# Patient Record
Sex: Female | Born: 2000 | Race: Black or African American | Hispanic: No | Marital: Single | Smoking: Never smoker
Health system: Southern US, Community
[De-identification: ages and names within clinical notes are randomized; demographics above are authoritative.]

## PROBLEM LIST (undated history)

## (undated) DIAGNOSIS — A749 Chlamydial infection, unspecified: Secondary | ICD-10-CM

## (undated) DIAGNOSIS — F32A Depression, unspecified: Secondary | ICD-10-CM

## (undated) DIAGNOSIS — T7840XA Allergy, unspecified, initial encounter: Secondary | ICD-10-CM

## (undated) DIAGNOSIS — J45909 Unspecified asthma, uncomplicated: Secondary | ICD-10-CM

## (undated) DIAGNOSIS — F419 Anxiety disorder, unspecified: Secondary | ICD-10-CM

## (undated) HISTORY — DX: Chlamydial infection, unspecified: A74.9

## (undated) HISTORY — DX: Allergy, unspecified, initial encounter: T78.40XA

## (undated) HISTORY — DX: Unspecified asthma, uncomplicated: J45.909

## (undated) HISTORY — DX: Anxiety disorder, unspecified: F41.9

## (undated) HISTORY — PX: NO PAST SURGERIES: SHX2092

## (undated) HISTORY — DX: Depression, unspecified: F32.A

---
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
## 2021-11-10 ENCOUNTER — Other Ambulatory Visit: Payer: Self-pay

## 2021-11-10 ENCOUNTER — Ambulatory Visit (INDEPENDENT_AMBULATORY_CARE_PROVIDER_SITE_OTHER): Payer: Medicaid Other | Admitting: Obstetrics

## 2021-11-10 ENCOUNTER — Encounter: Payer: Self-pay | Admitting: Obstetrics

## 2021-11-10 ENCOUNTER — Other Ambulatory Visit (HOSPITAL_COMMUNITY)
Admission: RE | Admit: 2021-11-10 | Discharge: 2021-11-10 | Disposition: A | Discharge status: Home / Self Care | Payer: Medicaid Other | Source: Ambulatory Visit | Attending: Obstetrics | Admitting: Obstetrics

## 2021-11-10 VITALS — BP 118/74 | Ht 62.0 in | Wt 180.0 lb

## 2021-11-10 DIAGNOSIS — N898 Other specified noninflammatory disorders of vagina: Secondary | ICD-10-CM | POA: Insufficient documentation

## 2021-11-10 NOTE — Progress Notes (Signed)
[name] is a 20 y.o. No obstetric history on file. who LMP was Patient's last menstrual period was 10/16/2021 (exact date)., presents today for a problem visit.   Patient complains of an abnormal vaginal discharge for 3 months. Discharge described as: white, curd-like, and odorless. Vaginal symptoms include bumps and vulvar itching.   Other associated symptoms: none.Menstrual pattern: She had been bleeding regularly. Contraception: none.  She denies recent antibiotic exposure, has changes in soaps, detergents coinciding with the onset of her symptoms.  She has previously self treated or been under treatment by another provider for these symptoms.  She is not presently sexually active. She also points out some bumps along her inner thighs that are similar to some under her arms. She has never had these evaluated by dermatology. She is due for an annual exam in late January.  O: BP 118/74    Ht 5\' 2"  (1.575 m)    Wt 180 lb (81.6 kg)    LMP 10/16/2021 (Exact Date)    BMI 32.92 kg/m  Review of Systems  Constitutional: Negative.   HENT: Negative.    Eyes: Negative.   Respiratory: Negative.    Cardiovascular: Negative.   Gastrointestinal: Negative.   Genitourinary: Negative.   Musculoskeletal: Negative.   Skin:        Bumps along her inner thighs that extend from her geitalia down along the thighs. Spec exam- scant white thin discharge, non malodorous, vaginal walls are slightly ruddy in appearance.  Neurological: Negative.   Endo/Heme/Allergies: Negative.   Psychiatric/Behavioral: Negative.    Physical Exam Constitutional:      Appearance: Normal appearance.  HENT:     Head: Normocephalic and atraumatic.  Cardiovascular:     Rate and Rhythm: Normal rate and regular rhythm.  Pulmonary:     Effort: Pulmonary effort is normal.     Breath sounds: Normal breath sounds.  Abdominal:     General: Abdomen is flat.     Palpations: Abdomen is soft.  Genitourinary:    Vagina: Vaginal  discharge present.     Rectum: Normal.     Comments: Numerous slightly raised bumps noted along inner thigh ; some appear possibly like condyloma.  Others run further down her thighs- and she has similar ones under her armpits and on her arms. Scant vaginal discharge noted: white, thin, non malodorous.- Aptima swab sent. Musculoskeletal:        General: Normal range of motion.     Cervical back: Normal range of motion.  Skin:    General: Skin is warm and dry.  Neurological:     General: No focal deficit present.     Mental Status: She is alert and oriented to person, place, and time.  Psychiatric:        Mood and Affect: Mood normal.        Behavior: Behavior normal.      1) Risk factors for bacterial vaginosis and candida infections discussed.  We discussed normal vaginal flora/microbiome.  Any factors that may alter the microbiome increase the risk of these opportunistic infections.  These include changes in pH, antibiotic exposures, diabetes, wet bathing suits etc.  We discussed that treatment is aimed at eradicating abnormal bacterial overgrowth and or yeast.  There may be some role for vaginal probiotics in restoring normal vaginal flora.    Aptima swab sent. Will treat per the results. Advised her to make an appointment with a dermatologist to evaluate the bumps I under her arms and elsewhere.  She will make an appointment for an Annual physical in January.  Alexandra Boyle, CNM  11/10/2021 5:12 PM

## 2021-11-12 LAB — CERVICOVAGINAL ANCILLARY ONLY
Bacterial Vaginitis (gardnerella): POSITIVE — AB
Candida Glabrata: NEGATIVE
Candida Vaginitis: NEGATIVE
Chlamydia: NEGATIVE
Comment: NEGATIVE
Comment: NEGATIVE
Comment: NEGATIVE
Comment: NEGATIVE
Comment: NEGATIVE
Comment: NORMAL
Neisseria Gonorrhea: NEGATIVE
Trichomonas: NEGATIVE

## 2021-11-13 ENCOUNTER — Encounter: Payer: Self-pay | Admitting: Obstetrics

## 2021-11-13 ENCOUNTER — Other Ambulatory Visit: Payer: Self-pay | Admitting: Obstetrics

## 2021-11-13 DIAGNOSIS — N760 Acute vaginitis: Secondary | ICD-10-CM

## 2021-11-13 MED ORDER — METRONIDAZOLE 500 MG PO TABS: 500.0000 mg | ORAL_TABLET | Freq: Two times a day (BID) | ORAL | 0 refills | Status: AC

## 2021-11-13 NOTE — Progress Notes (Unsigned)
metronidazole 

## 2021-12-01 ENCOUNTER — Ambulatory Visit (INDEPENDENT_AMBULATORY_CARE_PROVIDER_SITE_OTHER): Admitting: Obstetrics

## 2021-12-01 ENCOUNTER — Other Ambulatory Visit: Payer: Self-pay

## 2021-12-01 ENCOUNTER — Encounter: Payer: Self-pay | Admitting: Obstetrics

## 2021-12-01 VITALS — BP 118/74 | Ht 62.0 in | Wt 181.0 lb

## 2021-12-01 DIAGNOSIS — Z113 Encounter for screening for infections with a predominantly sexual mode of transmission: Secondary | ICD-10-CM

## 2021-12-01 DIAGNOSIS — Z01419 Encounter for gynecological examination (general) (routine) without abnormal findings: Secondary | ICD-10-CM

## 2021-12-01 NOTE — Progress Notes (Signed)
Gynecology Annual Exam  PCP: [facility]  Chief Complaint:  Chief Complaint  Patient presents with   Annual Exam    History of Present Illness:  [name] is a 21 y.o. G0P0000 who LMP was Patient's last menstrual period was 11/18/2021 (exact date)., presents today for her annual examination.  Her menses are regular every 28-30 days, lasting 5 day(s).  Dysmenorrhea mild, occurring  occasionally . She does not have intermenstrual bleeding.  She is not sexually active.  Last Pap: NA for her age Hx of STDs: none  There is no FH of breast cancer. There is no FH of ovarian cancer. The patient does not do self-breast exams.  Tobacco use: The patient denies current or previous tobacco use. Alcohol use: none Exercise: moderately active    The patient wears seatbelts: yes.   The patient reports that domestic violence in her life is absent.   Past Medical History:  Diagnosis Date   Chlamydia     Past Surgical History:  Procedure Laterality Date   NO PAST SURGERIES      Prior to Admission medications   Medication Sig Start Date End Date Taking? Authorizing Provider  Norethindrone-Ethinyl Estradiol-Fe Biphas (LO LOESTRIN FE) 1 MG-10 MCG / 10 MCG tablet Take 1 tablet by mouth daily. 11/28/20 02/20/21  [name], CNM    Allergies  Allergen Reactions   Nickel Swelling and Rash    At site of exposure    Gynecologic History: Patient's last menstrual period was 11/18/2021 (exact date). History of abnormal pap smear: No History of STI: Yes   Obstetric History: G0P0000  Social History   Socioeconomic History   Marital status: Single    Spouse name: Not on file   Number of children: Not on file   Years of education: Not on file   Highest education level: Not on file  Occupational History   Not on file  Tobacco Use   Smoking status: Never   Smokeless tobacco: Never  Substance and Sexual Activity   Alcohol use: Not on file    Comment: occ   Drug use:  Yes    Types: Marijuana   Sexual activity: Not Currently    Birth control/protection: Condom  Other Topics Concern   Not on file  Social History Narrative   Not on file   Social Determinants of Health   Financial Resource Strain: Not on file  Food Insecurity: Not on file  Transportation Needs: Not on file  Physical Activity: Not on file  Stress: Not on file  Social Connections: Not on file  Intimate Partner Violence: Not on file    No family history on file.  Review of Systems  Constitutional: Negative.   HENT: Negative.    Eyes: Negative.   Respiratory: Negative.    Cardiovascular: Negative.   Skin: Negative.   All other systems reviewed and are negative.   Physical Exam BP 118/74    Ht 5\' 2"  (1.575 m)    Wt 181 lb (82.1 kg)    LMP 11/18/2021 (Exact Date)    BMI 33.11 kg/m    Physical Exam Constitutional:      Appearance: Normal appearance. She is obese.  Genitourinary:     Vulva and rectum normal.     Genitourinary Comments: No external lesions. Multiple slightly raisedbumps noted along her inner thighs. NOT HSV like. No vaginal discharge today. Aptima swab gathered at her request.Uterus is non enlarged, mobile, anteverted.  HENT:     Head:  Normocephalic and atraumatic.  Cardiovascular:     Rate and Rhythm: Normal rate and regular rhythm.     Pulses: Normal pulses.     Heart sounds: Normal heart sounds.  Pulmonary:     Effort: Pulmonary effort is normal.     Breath sounds: Normal breath sounds.  Abdominal:     General: Bowel sounds are normal.     Palpations: Abdomen is soft.  Musculoskeletal:        General: Normal range of motion.     Cervical back: Normal range of motion and neck supple.  Neurological:     General: No focal deficit present.     Mental Status: She is alert and oriented to person, place, and time.  Skin:    General: Skin is warm and dry.  Psychiatric:        Mood and Affect: Mood normal.        Behavior: Behavior normal.    Female  chaperone present for pelvic and breast  portions of the physical exam  Results:  PHQ-9: 1   Assessment: 21 y.o. G0P0000 female here for routine annual gynecologic examination Requesting blood work for STI. Recently treated for BV Abstinent  Plan: Problem List Items Addressed This Visit   None Visit Diagnoses     Screen for STD (sexually transmitted disease)    -  Primary   Relevant Orders   HEP, RPR, HIV Panel   Women's annual routine gynecological examination           Screening: -- Blood pressure screen normal -- Weight screening: normal -- Depression screening negative (PHQ-9) -- Nutrition: normal -- cholesterol screening: not due for screening -- osteoporosis screening: not due -- tobacco screening: not using -- alcohol screening: AUDIT questionnaire indicates low-risk usage. -- family history of breast cancer screening: done. not at high risk. -- no evidence of domestic violence or intimate partner violence. -- STD screening: gonorrhea/chlamydia NAAT collected -- pap smear not collected per ASCCP guidelines -- flu vaccine  declines- per her PCP -- HPV vaccination series: receivedin the past  [name], CNM  12/01/2021 9:14 AM   12/01/2021 9:10 AM

## 2021-12-02 ENCOUNTER — Other Ambulatory Visit (HOSPITAL_COMMUNITY)
Admission: RE | Admit: 2021-12-02 | Discharge: 2021-12-02 | Disposition: A | Discharge status: Home / Self Care | Source: Ambulatory Visit | Attending: Obstetrics | Admitting: Obstetrics

## 2021-12-02 ENCOUNTER — Encounter: Payer: Self-pay | Admitting: Obstetrics

## 2021-12-02 DIAGNOSIS — Z113 Encounter for screening for infections with a predominantly sexual mode of transmission: Secondary | ICD-10-CM | POA: Diagnosis present

## 2021-12-02 LAB — HEP, RPR, HIV PANEL
HIV Screen 4th Generation wRfx: NONREACTIVE
Hepatitis B Surface Ag: NEGATIVE
RPR Ser Ql: NONREACTIVE

## 2021-12-02 NOTE — Addendum Note (Signed)
Addended by: Imagene Riches on: 12/02/2021 09:41 AM   Modules accepted: Orders

## 2021-12-03 LAB — CERVICOVAGINAL ANCILLARY ONLY
Bacterial Vaginitis (gardnerella): NEGATIVE
Candida Glabrata: NEGATIVE
Candida Vaginitis: NEGATIVE
Chlamydia: NEGATIVE
Comment: NEGATIVE
Comment: NEGATIVE
Comment: NEGATIVE
Comment: NEGATIVE
Comment: NEGATIVE
Comment: NORMAL
Neisseria Gonorrhea: NEGATIVE
Trichomonas: NEGATIVE

## 2022-04-27 IMAGING — US US BREAST*R* LIMITED INC AXILLA
1 series · 14 of 15 positions shown · non-contrast
Comparison: None.

CLINICAL DATA: 19-year-old female presenting with a new lump in the
right breast.

EXAM:
ULTRASOUND OF THE RIGHT BREAST

[Series 1: us breast*right* limited inc axilla · 0.06mm/px · 14 of 15 slices shown]
[im 1/15]
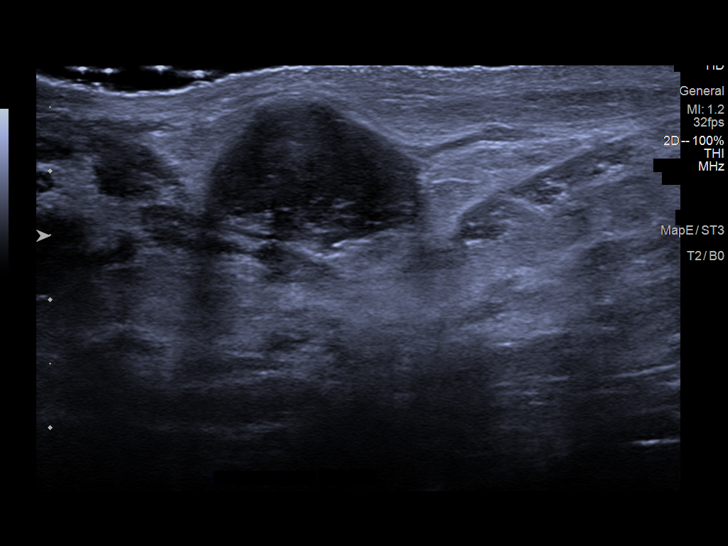
[im 2/15]
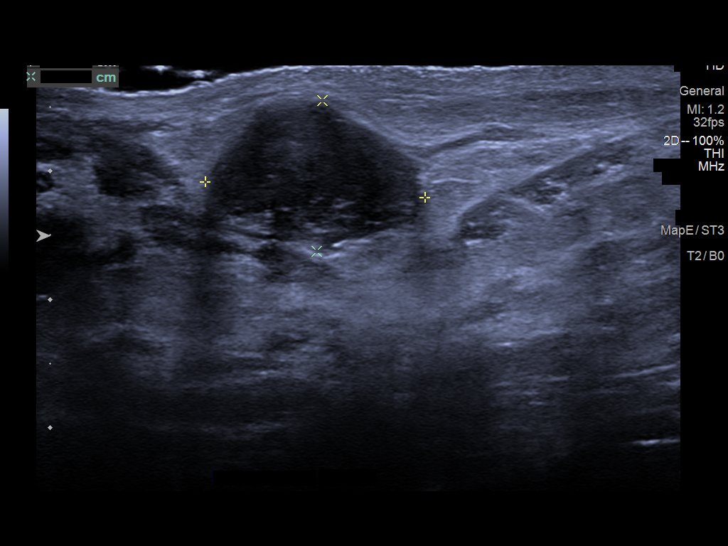
[im 3/15]
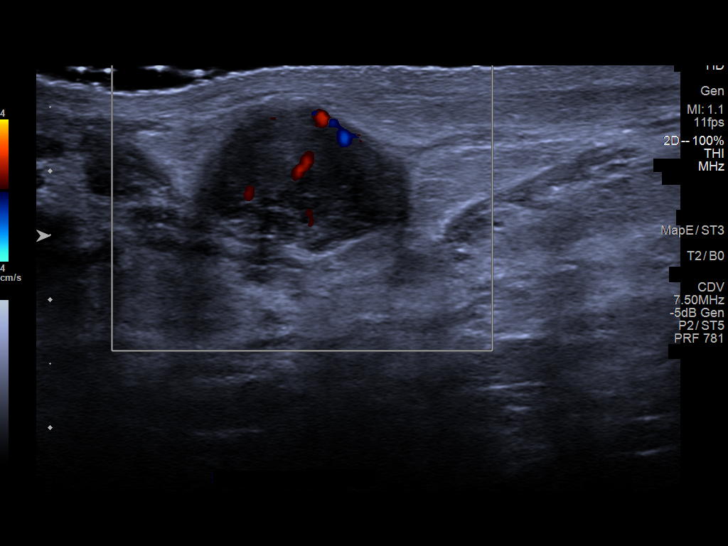
[im 4/15]
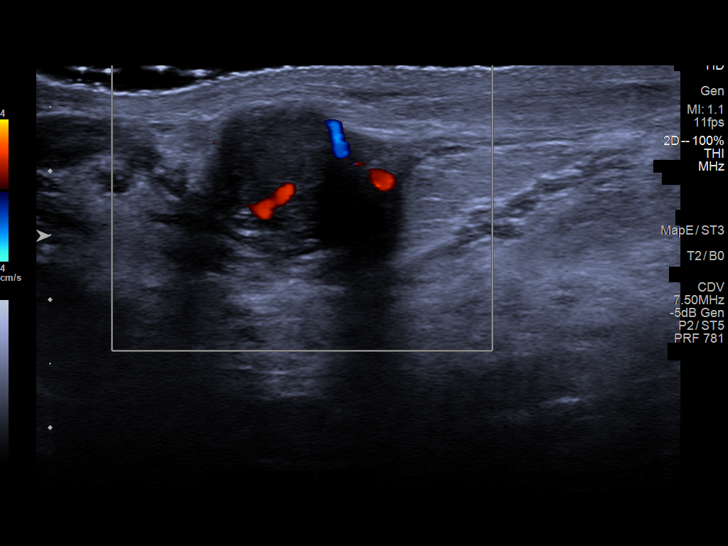
[im 5/15]
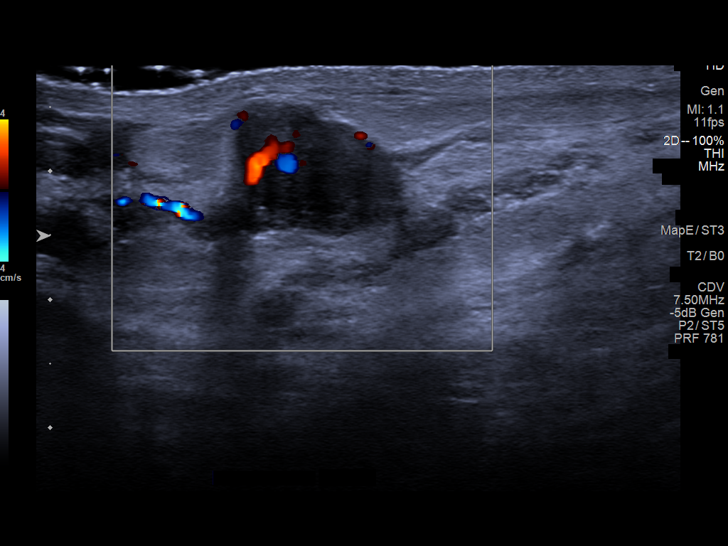
[im 6/15]
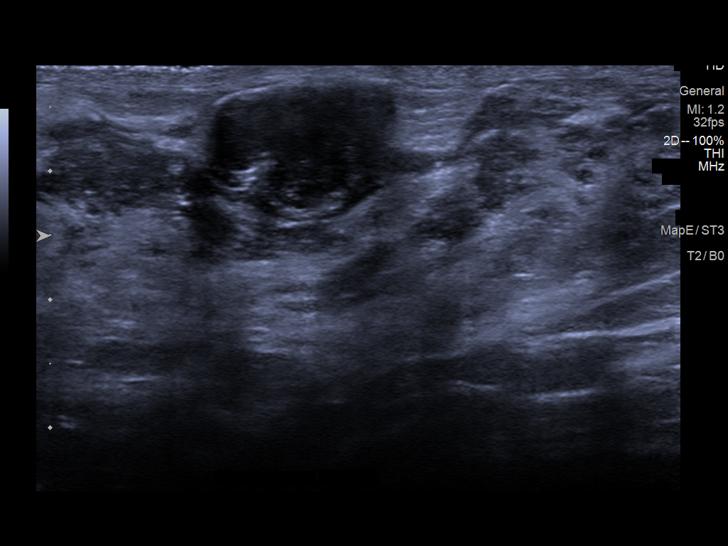
[im 7/15]
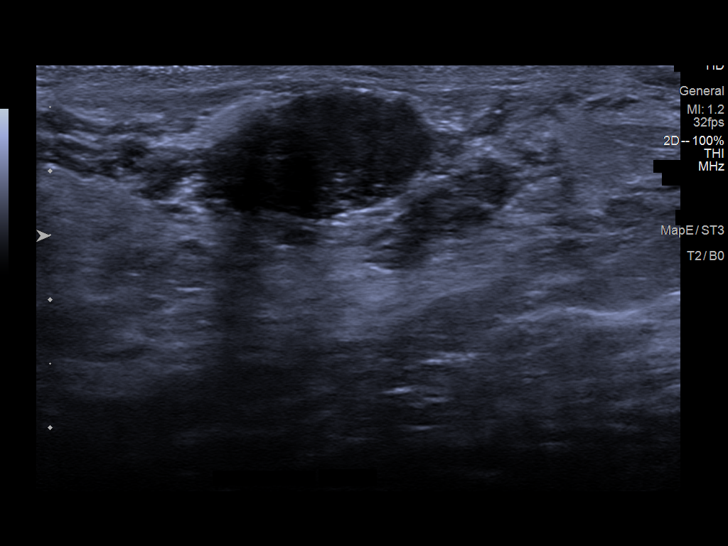
[im 9/15]
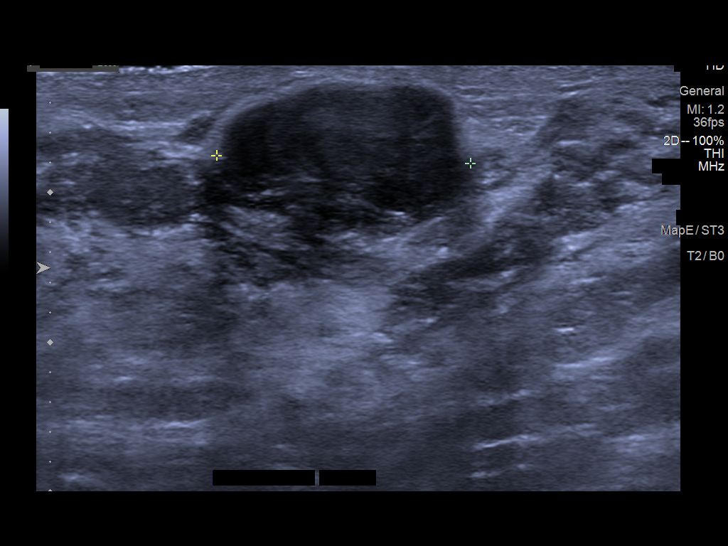
[im 10/15]
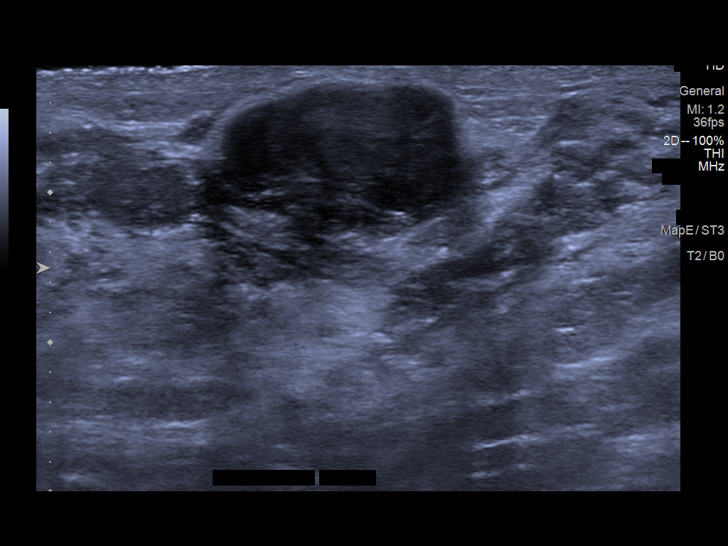
[im 11/15]
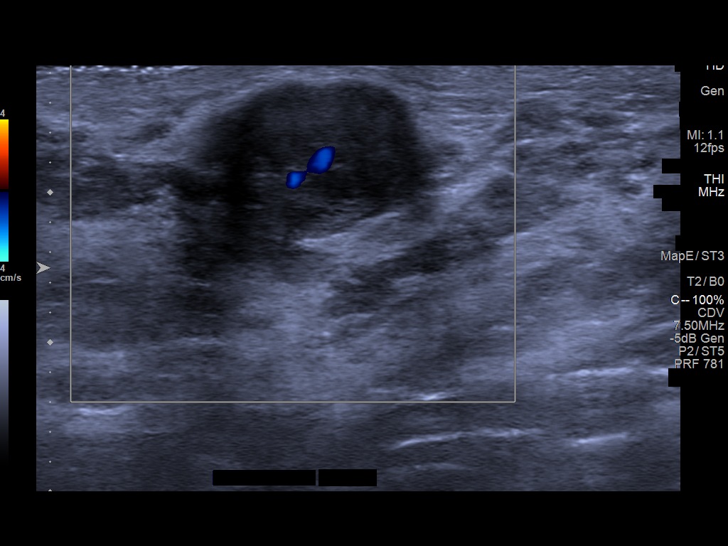
[im 12/15]
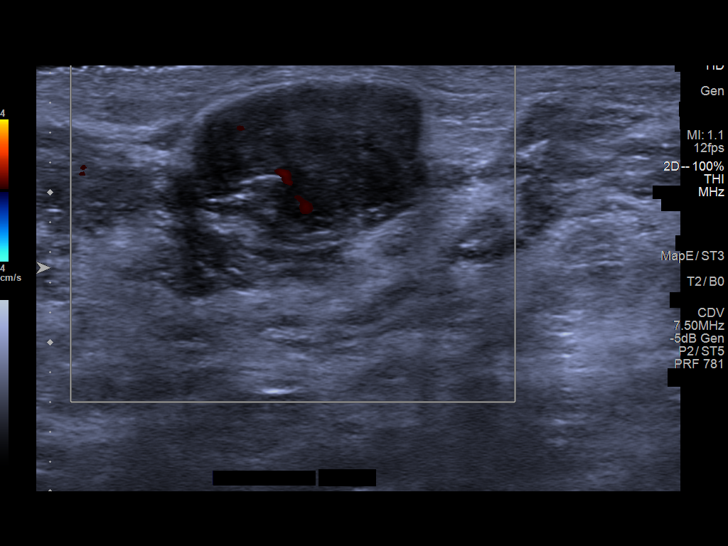
[im 13/15]
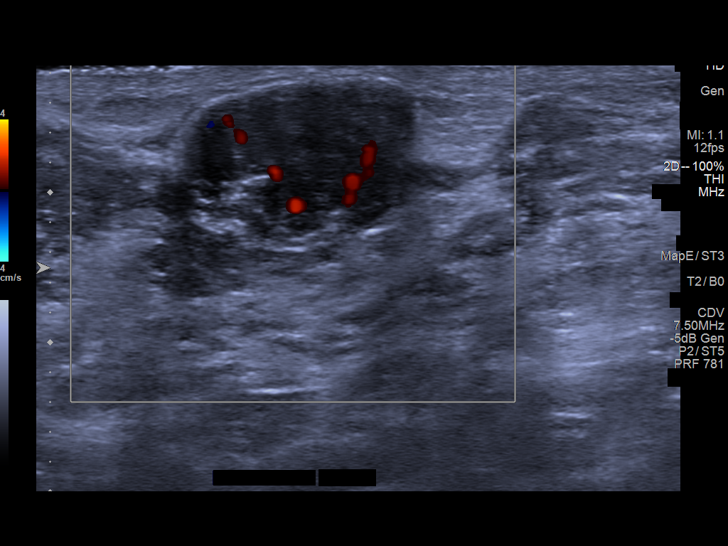
[im 14/15]
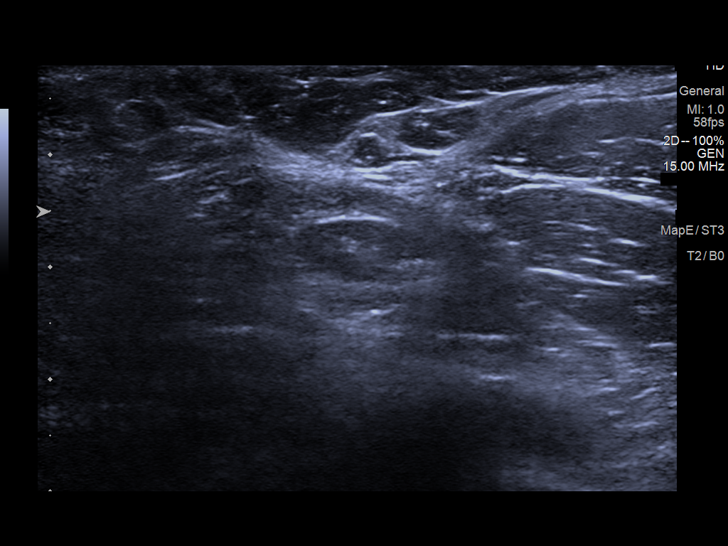
[im 15/15]
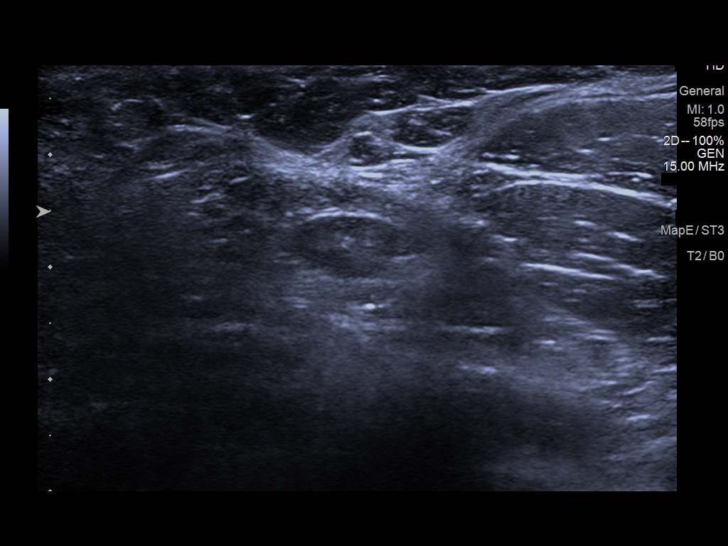

[14 of 15 positions shown; findings below may reference images not displayed]

FINDINGS: On physical exam, I feel a discrete mass at the site of concern
reported by the patient in the upper inner right breast.

Targeted ultrasound is performed in the right breast at 1 o'clock 2
cm from nipple at the palpable site of concern demonstrating an oval
circumscribed hypoechoic mass with some heterogeneity of the
echotexture along the inferior portion of the mass. The mass overall
measures 1.7 x 1.2 x 1.7 cm. There is internal vascularity. Targeted
ultrasound of the right axilla demonstrates normal-appearing lymph
nodes.
IMPRESSION: Indeterminate mass in the right breast at 1 o'clock, possibly a
fibroadenoma.

RECOMMENDATION:
Ultrasound-guided core needle biopsy of the right breast mass at 1
o'clock.

I have discussed the findings and recommendations with the patient
who agrees to proceed with biopsy. If applicable, a reminder letter
will be sent to the patient regarding the next appointment.

BI-RADS CATEGORY  4: Suspicious.

## 2022-05-03 ENCOUNTER — Ambulatory Visit: Admitting: Dermatology

## 2022-05-07 IMAGING — MG US  BREAST BX W/ LOC DEV 1ST LESION IMG BX SPEC US GUIDE*R*
1 series · 3 of 3 positions shown · non-contrast
Comparison: Previous exam(s).
COMPARISON: Previous exam(s).
COMPARISON: Previous exam(s).

Addendum:
CLINICAL DATA: Patient presents for ultrasound-guided core needle
biopsy of a palpable right breast mass.

EXAM:
ULTRASOUND GUIDED RIGHT BREAST CORE NEEDLE BIOPSY

[Series 1: MG view · 0.06mm/px · 3 of 3 slices shown]
[im 1/3]
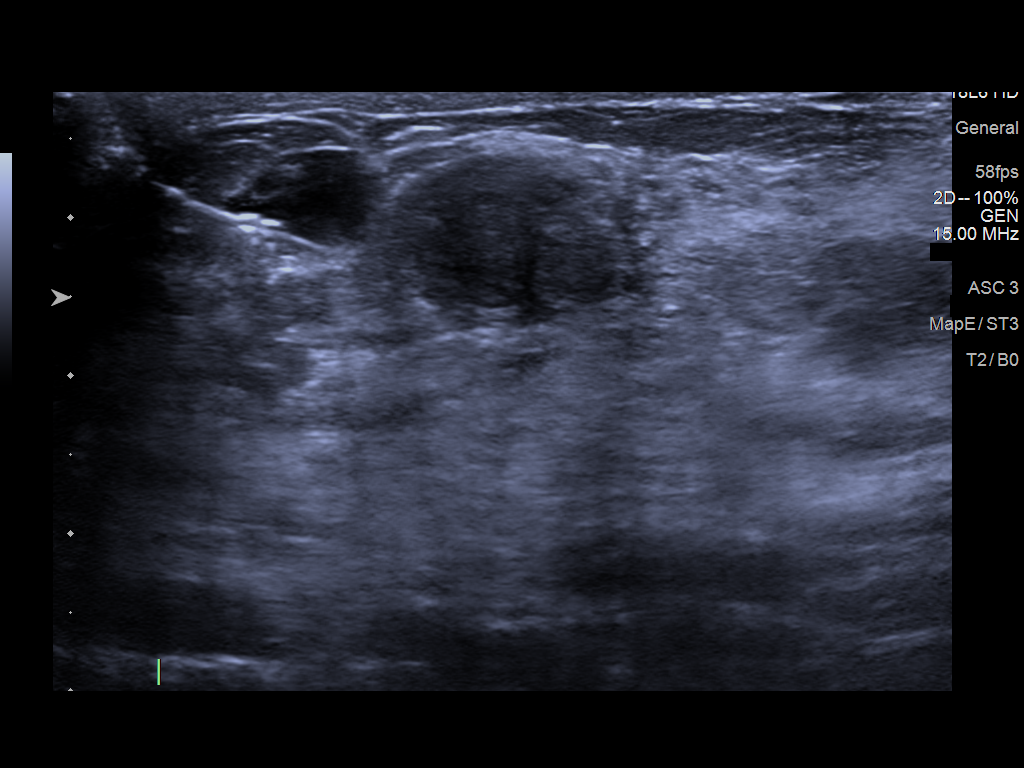
[im 2/3]
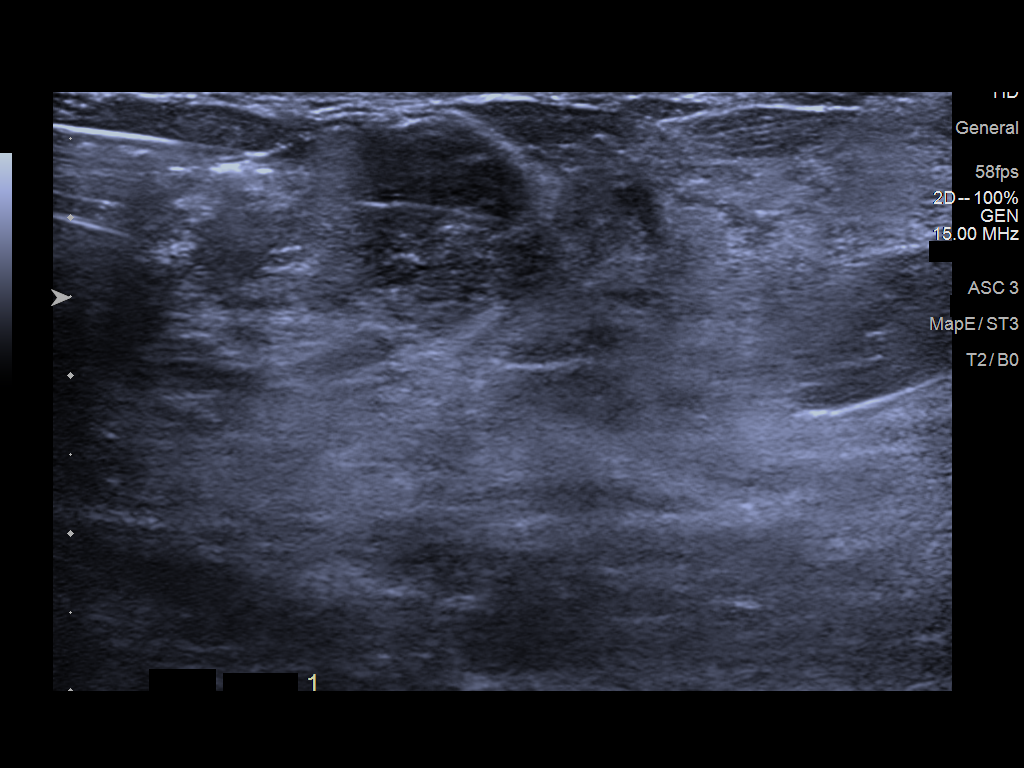
[im 3/3]
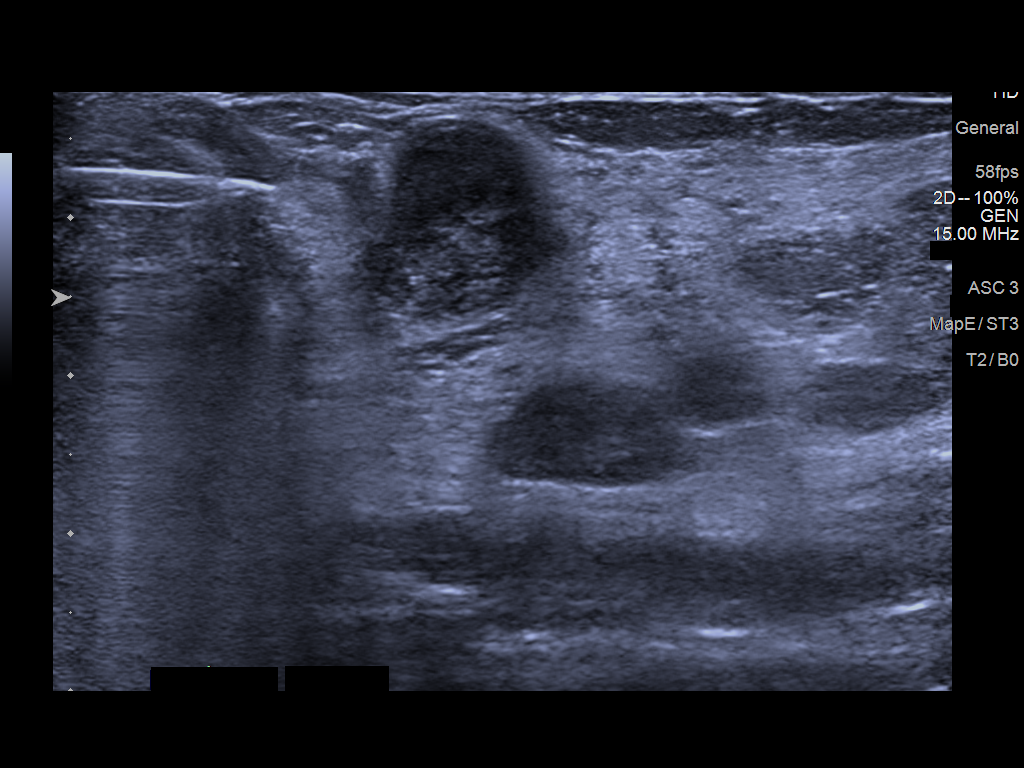

[3 of 3 positions shown; findings below may reference images not displayed]



Lesion quadrant: Upper inner quadrant

Using sterile technique and 1% Lidocaine as local anesthetic, under
direct ultrasound visualization, a 12 gauge Tlig device was
used to perform biopsy of the 1 o'clock position right breast mass
using a inferomedial approach. At the conclusion of the procedure Q
shaped tissue marker clip was deployed into the biopsy cavity.
Follow up 2 view mammogram was performed and dictated separately.
IMPRESSION: Ultrasound guided biopsy of a right breast mass. No apparent
complications.

ADDENDUM:
Post procedure mammogram was not performed due to the patient's
young age. The Q shaped clip was seen deploying along 1 margin of
the mass sonographically.

ADDENDUM:
PATHOLOGY revealed: A. BREAST, RIGHT AT [DATE], 2 CM FROM THE NIPPLE;
ULTRASOUND-GUIDED CORE NEEDLE BIOPSY: - BENIGN FIBROADENOMA. -
NEGATIVE FOR ATYPICAL PROLIFERATIVE BREAST DISEASE.

Pathology results are CONCORDANT with imaging findings, per Dr.
Gylgi Darder.

Pathology results and recommendations below were discussed with
patient by telephone on 12/03/2020. Patient reported biopsy site
within normal limits with slight tenderness at the site. Post biopsy
care instructions were reviewed, questions were answered and my
direct phone number was provided to patient. Patient was instructed
to call [HOSPITAL] if any concerns or questions arise
related to the biopsy.

Recommendation: Patient instructed to continue with monthly self
breast examinations, clinical follow-up as needed, and begin
bilateral screening mammogram at age 41.

Pathology results reported by Tyron Williams RN on 12/04/2020.

*** End of Addendum ***
Addendum:
PROCEDURE:
I met with the patient and we discussed the procedure of
ultrasound-guided biopsy, including benefits and alternatives. We
discussed the high likelihood of a successful procedure. We
discussed the risks of the procedure, including infection, bleeding,
tissue injury, clip migration, and inadequate sampling. Informed
written consent was given. The usual time-out protocol was performed
immediately prior to the procedure.

Lesion quadrant: Upper inner quadrant

Using sterile technique and 1% Lidocaine as local anesthetic, under
direct ultrasound visualization, a 12 gauge Tlig device was
used to perform biopsy of the 1 o'clock position right breast mass
using a inferomedial approach. At the conclusion of the procedure Q
shaped tissue marker clip was deployed into the biopsy cavity.
Follow up 2 view mammogram was performed and dictated separately.
IMPRESSION: Ultrasound guided biopsy of a right breast mass. No apparent
complications.

ADDENDUM:
Post procedure mammogram was not performed due to the patient's
young age. The Q shaped clip was seen deploying along 1 margin of
the mass sonographically.



Lesion quadrant: Upper inner quadrant

Using sterile technique and 1% Lidocaine as local anesthetic, under
direct ultrasound visualization, a 12 gauge Tlig device was
used to perform biopsy of the 1 o'clock position right breast mass
using a inferomedial approach. At the conclusion of the procedure Q
shaped tissue marker clip was deployed into the biopsy cavity.
Follow up 2 view mammogram was performed and dictated separately.
IMPRESSION: Ultrasound guided biopsy of a right breast mass. No apparent
complications.

## 2022-08-04 DIAGNOSIS — J020 Streptococcal pharyngitis: Secondary | ICD-10-CM | POA: Diagnosis not present

## 2022-08-05 ENCOUNTER — Other Ambulatory Visit (HOSPITAL_COMMUNITY)
Admission: RE | Admit: 2022-08-05 | Discharge: 2022-08-05 | Disposition: A | Discharge status: Home / Self Care | Source: Ambulatory Visit | Attending: Obstetrics and Gynecology | Admitting: Obstetrics and Gynecology

## 2022-08-05 ENCOUNTER — Ambulatory Visit: Payer: Medicaid Other

## 2022-08-05 VITALS — BP 120/80 | Ht 63.0 in | Wt 183.0 lb

## 2022-08-05 DIAGNOSIS — Z113 Encounter for screening for infections with a predominantly sexual mode of transmission: Secondary | ICD-10-CM

## 2022-08-05 DIAGNOSIS — N898 Other specified noninflammatory disorders of vagina: Secondary | ICD-10-CM | POA: Diagnosis present

## 2022-08-05 NOTE — Progress Notes (Addendum)
vaginal discharge for 3 weeks, milky, does not smell fishy. Pt reports using Boric acid on and off then discharge will come back. Has had BV in the past but not sure if this is it. Wants GC testing with yeast and BV and Trich. Pt also request sti blood work

## 2022-08-05 NOTE — Addendum Note (Signed)
Addended by: Quintella Baton D on: 08/05/2022 02:55 PM   Modules accepted: Orders

## 2022-08-05 NOTE — Addendum Note (Signed)
Addended by: Quintella Baton D on: 08/05/2022 03:01 PM   Modules accepted: Orders

## 2022-08-05 NOTE — Addendum Note (Signed)
Addended by: Quintella Baton D on: 08/05/2022 03:08 PM   Modules accepted: Orders

## 2022-08-06 LAB — HEP, RPR, HIV PANEL
HIV Screen 4th Generation wRfx: NONREACTIVE
Hepatitis B Surface Ag: NEGATIVE
RPR Ser Ql: NONREACTIVE

## 2022-08-07 LAB — HSV 1 AND 2 AB, IGG
HSV 1 Glycoprotein G Ab, IgG: <0.91 {index} (ref 0.00–0.90)
HSV 2 IgG, Type Spec: <0.91 {index} (ref 0.00–0.90)

## 2022-08-07 LAB — SPECIMEN STATUS REPORT

## 2022-08-08 LAB — CERVICOVAGINAL ANCILLARY ONLY
Bacterial Vaginitis (gardnerella): POSITIVE — AB
Candida Glabrata: NEGATIVE
Candida Vaginitis: NEGATIVE
Chlamydia: NEGATIVE
Comment: NEGATIVE
Comment: NEGATIVE
Comment: NEGATIVE
Comment: NEGATIVE
Comment: NEGATIVE
Comment: NORMAL
Neisseria Gonorrhea: NEGATIVE
Trichomonas: NEGATIVE

## 2022-08-10 ENCOUNTER — Encounter: Payer: Self-pay | Admitting: Obstetrics

## 2022-08-10 ENCOUNTER — Other Ambulatory Visit: Payer: Self-pay | Admitting: Obstetrics

## 2022-08-10 DIAGNOSIS — B9689 Other specified bacterial agents as the cause of diseases classified elsewhere: Secondary | ICD-10-CM

## 2022-08-10 MED ORDER — METRONIDAZOLE 500 MG PO TABS: 500.0000 mg | ORAL_TABLET | Freq: Two times a day (BID) | ORAL | 0 refills | Status: AC

## 2022-08-10 NOTE — Progress Notes (Signed)
Patient was tested in the office for a vaginal discharge with odor. She is positive for BV per the Aptima swab. I have sent ina RX for Metronidazole for her and will notify her via [place].  Imagene Riches, CNM  08/10/2022 8:38 PM

## 2022-09-10 ENCOUNTER — Encounter: Payer: Self-pay | Admitting: Nurse Practitioner

## 2022-09-10 ENCOUNTER — Ambulatory Visit: Payer: Medicaid Other | Admitting: Nurse Practitioner

## 2022-09-10 ENCOUNTER — Other Ambulatory Visit: Payer: Self-pay

## 2022-09-10 VITALS — BP 120/74 | HR 89 | Temp 98.8°F | Resp 20 | Ht 63.25 in | Wt 185.4 lb

## 2022-09-10 DIAGNOSIS — E6609 Other obesity due to excess calories: Secondary | ICD-10-CM | POA: Diagnosis not present

## 2022-09-10 DIAGNOSIS — Z7689 Persons encountering health services in other specified circumstances: Secondary | ICD-10-CM | POA: Diagnosis not present

## 2022-09-10 DIAGNOSIS — Z6832 Body mass index (BMI) 32.0-32.9, adult: Secondary | ICD-10-CM | POA: Diagnosis not present

## 2022-09-10 DIAGNOSIS — Z0000 Encounter for general adult medical examination without abnormal findings: Secondary | ICD-10-CM

## 2022-09-10 DIAGNOSIS — Z13220 Encounter for screening for lipoid disorders: Secondary | ICD-10-CM

## 2022-09-10 DIAGNOSIS — Z131 Encounter for screening for diabetes mellitus: Secondary | ICD-10-CM

## 2022-09-10 DIAGNOSIS — Z23 Encounter for immunization: Secondary | ICD-10-CM | POA: Diagnosis not present

## 2022-09-10 DIAGNOSIS — Z1159 Encounter for screening for other viral diseases: Secondary | ICD-10-CM | POA: Diagnosis not present

## 2022-09-10 DIAGNOSIS — Z114 Encounter for screening for human immunodeficiency virus [HIV]: Secondary | ICD-10-CM | POA: Diagnosis not present

## 2022-09-10 NOTE — Progress Notes (Signed)
Name: Alexandra Boyle   MRN: [id number]    DOB: 10/30/2001   Date:09/10/2022       Progress Note  Subjective  Chief Complaint  Chief Complaint  Patient presents with   Establish Care    HPI  Patient presents for annual CPE and establish care  Establish care : Her last physical was in 2018. No medical history.  Does not currently take any medications.  Family history: diabetes   Diet: she says she eats whatever she wants Exercise: she does not exercise, recommend increasing physical activity to at least 150 minutes a week. Sleep: 6 hours Last dental exam:2020 Last eye exam: this year  [place] Visit from 09/10/2022 in [facility]  AUDIT-C Score 1      Depression: Phq 9 is  negative    09/10/2022    9:50 AM  Depression screen PHQ 2/9  Decreased Interest 0  Down, Depressed, Hopeless 0  PHQ - 2 Score 0   Hypertension: BP Readings from Last 3 Encounters:  09/10/22 120/74  08/05/22 120/80  12/01/21 118/74   Obesity: Wt Readings from Last 3 Encounters:  09/10/22 185 lb 6.4 oz (84.1 kg)  08/05/22 183 lb (83 kg)  12/01/21 181 lb (82.1 kg) (95 %, Z= 1.61)*   * Growth percentiles are based on CDC (Girls, 2-20 Years) data.   BMI Readings from Last 3 Encounters:  09/10/22 32.58 kg/m  08/05/22 32.42 kg/m  12/01/21 33.11 kg/m (96 %, Z= 1.72)*   * Growth percentiles are based on CDC (Girls, 2-20 Years) data.     Vaccines:  HPV: up to at age 61 , ask insurance if age between 69-45  Shingrix: 70-64 yo and ask insurance if covered when patient above 13 yo Pneumonia:  educated and discussed with patient. Flu:  educated and discussed with patient.  Hep C Screening: ordered STD testing and prevention (HIV/chl/gon/syphilis): ordered Intimate partner violence:none Sexual History :sexually active, one partner, not consistent with birth control Menstrual History/LMP/Abnormal Bleeding: [facility]: 08/16/2022 Incontinence Symptoms: none  Breast  cancer:  - Last Mammogram: No concerns does not qualify, she previously had an ultrasound and biopsy of her right breast due to a lump.  It was just fibrous tissue. - BRCA gene screening: no family history  Osteoporosis: Discussed high calcium and vitamin D supplementation, weight bearing exercises  Cervical cancer screening: No concerns does not qualify, discussed with patient screening starts next year, she is a patient at [facility]  Skin cancer: Discussed monitoring for atypical lesions  Colorectal cancer: No concerns does not qualify Lung cancer:   Low Dose CT Chest recommended if Age 27-80 years, 20 pack-year currently smoking OR have quit w/in 15years. Patient does not qualify.   ECG: none, no chest pain or shortness of breath  Advanced Care Planning: A voluntary discussion about advance care planning including the explanation and discussion of advance directives.  Discussed health care proxy and Living will, and the patient was able to identify a health care proxy as mom.  Patient does not have a living will at present time. If patient does have living will, I have requested they bring this to the clinic to be scanned in to their chart.  Lipids: No results found for: "CHOL" No results found for: "HDL" No results found for: "LDLCALC" No results found for: "TRIG" No results found for: "CHOLHDL" No results found for: "LDLDIRECT"  Glucose: No results found for: "GLUCOSE", "GLUCAP"  Patient Active Problem List   Diagnosis  Date Noted   Class 1 obesity due to excess calories without serious comorbidity with body mass index (BMI) of 32.0 to 32.9 in adult 09/10/2022   Vaginal discharge 11/10/2021   Encounter for contraceptive management 11/28/2020    Past Surgical History:  Procedure Laterality Date   NO PAST SURGERIES      Family History  Problem Relation Age of Onset   Diabetes Maternal Grandmother     Social History   Socioeconomic History   Marital status: Single     Spouse name: Not on file   Number of children: Not on file   Years of education: Not on file   Highest education level: Not on file  Occupational History   Not on file  Tobacco Use   Smoking status: Never   Smokeless tobacco: Never  Vaping Use   Vaping Use: Never used  Substance and Sexual Activity   Alcohol use: Yes    Comment: occ   Drug use: Not Currently    Types: Marijuana   Sexual activity: Yes    Partners: Male  Other Topics Concern   Not on file  Social History Narrative   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   Social Determinants of Health   Financial Resource Strain: Low Risk  (09/10/2022)   Overall Financial Resource Strain (CARDIA)    Difficulty of Paying Living Expenses: Not hard at all  Food Insecurity: No Food Insecurity (09/10/2022)   Hunger Vital Sign    Worried About Running Out of Food in the Last Year: Never true    Ran Out of Food in the Last Year: Never true  Transportation Needs: No Transportation Needs (09/10/2022)   PRAPARE - Hydrologist (Medical): No    Lack of Transportation (Non-Medical): No  Physical Activity: Inactive (09/10/2022)   Exercise Vital Sign    Days of Exercise per Week: 0 days    Minutes of Exercise per Session: 0 min  Stress: No Stress Concern Present (09/10/2022)   [place]    Feeling of Stress : Not at all  Social Connections: Moderately Integrated (09/10/2022)   Social Connection and Isolation Panel [NHANES]    Frequency of Communication with Friends and Family: More than three times a week    Frequency of Social Gatherings with Friends and Family: More than three times a week    Attends Religious Services: More than 4 times per year    Active Member of [employer] or Organizations: Yes    Attends Archivist Meetings: More than 4 times per year    Marital Status:  Never married  Intimate Partner Violence: Not At Risk (09/10/2022)   Humiliation, Afraid, Rape, and Kick questionnaire    Fear of Current or Ex-Partner: No    Emotionally Abused: No    Physically Abused: No    Sexually Abused: No    No current outpatient medications on file.  Allergies  Allergen Reactions   Nickel Swelling and Rash    At site of exposure     ROS  Constitutional: Negative for fever or weight change.  Respiratory: Negative for cough and shortness of breath.   Cardiovascular: Negative for chest pain or palpitations.  Gastrointestinal: Negative for abdominal pain, no bowel changes.  Musculoskeletal: Negative for gait problem or joint swelling.  Skin: Negative for rash.  Neurological: Negative for dizziness or headache.  No  other specific complaints in a complete review of systems (except as listed in HPI above).   Objective  Vitals:   09/10/22 0950  BP: 120/74  Pulse: 89  Resp: 20  Temp: 98.8 F (37.1 C)  TempSrc: Oral  SpO2: 98%  Weight: 185 lb 6.4 oz (84.1 kg)  Height: 5' 3.25" (1.607 m)    Body mass index is 32.58 kg/m.  Physical Exam  Constitutional: Patient appears well-developed and well-nourished. No distress.  HENT: Head: Normocephalic and atraumatic. Ears: B TMs ok, no erythema or effusion; Nose: Nose normal. Mouth/Throat: Oropharynx is clear and moist. No oropharyngeal exudate.  Eyes: Conjunctivae and EOM are normal. Pupils are equal, round, and reactive to light. No scleral icterus.  Neck: Normal range of motion. Neck supple. No JVD present. No thyromegaly present.  Cardiovascular: Normal rate, regular rhythm and normal heart sounds.  No murmur heard. No BLE edema. Pulmonary/Chest: Effort normal and breath sounds normal. No respiratory distress. Abdominal: Soft. Bowel sounds are normal, no distension. There is no tenderness. no masses Musculoskeletal: Normal range of motion, no joint effusions. No gross deformities Neurological: he is  alert and oriented to person, place, and time. No cranial nerve deficit. Coordination, balance, strength, speech and gait are normal.  Skin: Skin is warm and dry. No rash noted. No erythema.  Psychiatric: Patient has a normal mood and affect. behavior is normal. Judgment and thought content normal.   Recent Results (from the past 2160 hour(s))  Cervicovaginal ancillary only     Status: Abnormal   Collection Time: 08/05/22  2:52 PM  Result Value Ref Range   Neisseria Gonorrhea Negative    Chlamydia Negative    Trichomonas Negative    Bacterial Vaginitis (gardnerella) Positive (A)    Candida Vaginitis Negative    Candida Glabrata Negative    Comment      Normal Reference Range Bacterial Vaginosis - Negative   Comment Normal Reference Range Candida Species - Negative    Comment Normal Reference Range Candida Galbrata - Negative    Comment Normal Reference Range Trichomonas - Negative    Comment Normal Reference Ranger Chlamydia - Negative    Comment      Normal Reference Range Neisseria Gonorrhea - Negative  HEP, RPR, HIV Panel     Status: None   Collection Time: 08/05/22  2:59 PM  Result Value Ref Range   Hepatitis B Surface Ag Negative Negative   RPR Ser Ql Non Reactive Non Reactive   HIV Screen 4th Generation wRfx Non Reactive Non Reactive    Comment: HIV Negative HIV-1/HIV-2 antibodies and HIV-1 p24 antigen were NOT detected. There is no laboratory evidence of HIV infection.   HSV 1 and 2 Ab, IgG     Status: None   Collection Time: 08/05/22  2:59 PM  Result Value Ref Range   HSV 1 Glycoprotein G Ab, IgG <0.91 0.00 - 0.90 index    Comment:                                  Negative        <0.91                                  Equivocal 0.91 - 1.09  Positive        >1.09  Note: Negative indicates no antibodies detected to  HSV-1. Equivocal may suggest early infection.  If  clinically appropriate, retest at later date. 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    HSV 2 IgG, Type Spec <0.91 0.00 - 0.90 index    Comment:                                  Negative        <0.91                                  Equivocal 0.91 - 1.09                                  Positive        >1.09  HSV-2 Antibody Interpretation: Negative indicates no  detectable antibodies to HSV-2 were found. If recent  exposure is suspected, retest in 4-6 weeks. Equivocal  samples should be retested in 4-6 weeks. Positive  indicates the presence of detectable IgG antibody to  HSV-2. False positive results may occur. Repeat  testing, or testing by a different method, may be  indicated in some settings (e.g. patients with low  likelihood of HSV infection). If clinically  appropriate, retest 4-6 weeks later.   Specimen status report     Status: None   Collection Time: 08/05/22  2:59 PM  Result Value Ref Range   specimen status report Comment     Comment: Written Authorization Written Authorization Written Authorization Received. Authorization received from [facility] 08-06-2022 Logged by Marcene Duos      Fall Risk:    09/10/2022    9:50 AM  [place] in the past year? 0  Number falls in past yr: 0  Injury with Fall? 0  Follow up Falls evaluation completed     Functional Status Survey: Is the patient deaf or have difficulty hearing?: No Does the patient have difficulty seeing, even when wearing glasses/contacts?: Yes Does the patient have difficulty concentrating, remembering, or making decisions?: No Does the patient have difficulty walking or climbing stairs?: No Does the patient have difficulty dressing or bathing?: No Does the patient have difficulty doing errands alone such as visiting a doctor's office or shopping?: No   Assessment & Plan 1. Annual physical exam  - Lipid panel - CBC with Differential/Platelet - COMPLETE METABOLIC PANEL WITH GFR - Hepatitis C antibody - Hemoglobin A1c - HIV  Antibody (routine testing w rflx) - TSH  2. Need for influenza vaccination  - Flu Vaccine QUAD 6+ mos PF IM (Fluarix Quad PF)  3. Encounter to establish care -cpe completed  4. Encounter for hepatitis C screening test for low risk patient  - Hepatitis C antibody  5. Screening for HIV without presence of risk factors  - HIV Antibody (routine testing w rflx)  6. Screening for diabetes mellitus  - COMPLETE METABOLIC PANEL WITH GFR - Hemoglobin A1c  7. Screening for cholesterol level  - Lipid panel  8. Class 1 obesity due to excess calories without serious comorbidity with body mass index (BMI) of 32.0 to 32.9 in adult  - Lipid panel - CBC with Differential/Platelet - COMPLETE METABOLIC PANEL WITH GFR - Hemoglobin A1c -  TSH     -USPSTF grade A and B recommendations reviewed with patient; age-appropriate recommendations, preventive care, screening tests, etc discussed and encouraged; healthy living encouraged; see AVS for patient education given to patient -Discussed importance of 150 minutes of physical activity weekly, eat two servings of fish weekly, eat one serving of tree nuts ( cashews, pistachios, pecans, almonds.Marland Kitchen) every other day, eat 6 servings of fruit/vegetables daily and drink plenty of water and avoid sweet beverages.

## 2022-09-12 LAB — CBC WITH DIFFERENTIAL/PLATELET
Absolute Monocytes: 636 cells/uL (ref 200–950)
Basophils Absolute: 131 cells/uL (ref 0–200)
Basophils Relative: 1.3 %
Eosinophils Absolute: 141 cells/uL (ref 15–500)
Eosinophils Relative: 1.4 %
HCT: 38.4 % (ref 35.0–45.0)
Hemoglobin: 12.9 g/dL (ref 11.7–15.5)
Lymphs Abs: 3939 cells/uL — ABNORMAL HIGH (ref 850–3900)
MCH: 32.5 pg (ref 27.0–33.0)
MCHC: 33.6 g/dL (ref 32.0–36.0)
MCV: 96.7 fL (ref 80.0–100.0)
MPV: 11.8 fL (ref 7.5–12.5)
Monocytes Relative: 6.3 %
Neutro Abs: 5252 cells/uL (ref 1500–7800)
Neutrophils Relative %: 52 %
Platelets: 137 10*3/uL — ABNORMAL LOW (ref 140–400)
RBC: 3.97 10*6/uL (ref 3.80–5.10)
RDW: 11.8 % (ref 11.0–15.0)
Total Lymphocyte: 39 %
WBC: 10.1 10*3/uL (ref 3.8–10.8)

## 2022-09-12 LAB — TSH: TSH: 3.68 mIU/L

## 2022-09-12 LAB — HEPATITIS C ANTIBODY: Hepatitis C Ab: NONREACTIVE

## 2022-09-12 LAB — COMPLETE METABOLIC PANEL WITH GFR
AG Ratio: 1.6 (calc) (ref 1.0–2.5)
ALT: 13 U/L (ref 6–29)
AST: 15 U/L (ref 10–30)
Albumin: 4.2 g/dL (ref 3.6–5.1)
Alkaline phosphatase (APISO): 45 U/L (ref 31–125)
BUN/Creatinine Ratio: 12 (calc) (ref 6–22)
BUN: 13 mg/dL (ref 7–25)
CO2: 25 mmol/L (ref 20–32)
Calcium: 9.1 mg/dL (ref 8.6–10.2)
Chloride: 107 mmol/L (ref 98–110)
Creat: 1.09 mg/dL — ABNORMAL HIGH (ref 0.50–0.96)
Globulin: 2.7 g/dL (calc) (ref 1.9–3.7)
Glucose, Bld: 83 mg/dL (ref 65–99)
Potassium: 4.7 mmol/L (ref 3.5–5.3)
Sodium: 140 mmol/L (ref 135–146)
Total Bilirubin: 0.5 mg/dL (ref 0.2–1.2)
Total Protein: 6.9 g/dL (ref 6.1–8.1)
eGFR: 75 mL/min/{1.73_m2} (ref >=60)

## 2022-09-12 LAB — HIV ANTIBODY (ROUTINE TESTING W REFLEX): HIV 1&2 Ab, 4th Generation: NONREACTIVE

## 2022-09-12 LAB — LIPID PANEL
Cholesterol: 140 mg/dL (ref <200)
HDL: 48 mg/dL — ABNORMAL LOW (ref >=50)
LDL Cholesterol (Calc): 78 mg/dL (calc)
Non-HDL Cholesterol (Calc): 92 mg/dL (calc) (ref <130)
Total CHOL/HDL Ratio: 2.9 (calc) (ref <5.0)
Triglycerides: 61 mg/dL (ref <150)

## 2022-09-12 LAB — HEMOGLOBIN A1C
Hgb A1c MFr Bld: 4.9 % of total Hgb (ref <5.7)
Mean Plasma Glucose: 94 mg/dL
eAG (mmol/L): 5.2 mmol/L

## 2022-09-13 NOTE — Progress Notes (Signed)
No vm on phone

## 2022-09-24 ENCOUNTER — Ambulatory Visit: Payer: Self-pay | Admitting: Nurse Practitioner

## 2022-09-26 ENCOUNTER — Ambulatory Visit: Admit: 2022-09-26 | Discharge status: Against Medical Advice

## 2022-09-27 DIAGNOSIS — J029 Acute pharyngitis, unspecified: Secondary | ICD-10-CM | POA: Diagnosis not present

## 2022-09-27 DIAGNOSIS — J039 Acute tonsillitis, unspecified: Secondary | ICD-10-CM | POA: Diagnosis not present

## 2023-03-20 DIAGNOSIS — M25561 Pain in right knee: Secondary | ICD-10-CM | POA: Diagnosis not present

## 2023-03-20 DIAGNOSIS — M25562 Pain in left knee: Secondary | ICD-10-CM | POA: Diagnosis not present

## 2023-03-24 ENCOUNTER — Ambulatory Visit: Payer: Medicaid Other | Admitting: Obstetrics

## 2023-03-24 ENCOUNTER — Encounter: Payer: Self-pay | Admitting: Obstetrics

## 2023-03-24 VITALS — BP 128/71 | HR 66 | Wt 191.4 lb

## 2023-03-24 DIAGNOSIS — Z30017 Encounter for initial prescription of implantable subdermal contraceptive: Secondary | ICD-10-CM

## 2023-03-24 DIAGNOSIS — Z3202 Encounter for pregnancy test, result negative: Secondary | ICD-10-CM | POA: Diagnosis not present

## 2023-03-24 LAB — POCT URINE PREGNANCY: Preg Test, Ur: NEGATIVE

## 2023-03-24 MED ORDER — ETONOGESTREL 68 MG ~~LOC~~ IMPL
68.0000 mg | DRUG_IMPLANT | Freq: Once | SUBCUTANEOUS | Status: AC
  Administered 2023-03-24: 68 mg via SUBCUTANEOUS

## 2023-03-24 NOTE — Progress Notes (Signed)
Alexandra Boyle is here for Nexplanon insertion.Alexandra Boyle opts for Nexplanon for contraception.  She understands that Nexplanon is a progesterone only therapy, and that patients often patients have irregular and unpredictable vaginal bleeding or amenorrhea. She understands that other side effects are possible related to systemic progesterone, including but not limited to, headaches, breast tenderness, nausea, and irritability. While effective at preventing pregnancy long acting reversible contraceptives do not prevent transmission of sexually transmitted diseases and use of barrier methods for this purpose was discussed.  The placement procedure for Nexplanon was reviewed with the Alexandra Boyle in detail including risks of nerve injury, infection, bleeding and injury to other muscles or tendons. She understands that the Nexplanon implant is good for 3 years and needs to be removed at the end of that time.  She understands that Nexplanon is an extremely effective option for contraception, with failure rate of <1%.     GYNECOLOGY PROCEDURE NOTE  Alexandra Boyle is a 22 y.o. G0P0000 presenting for Nexplanon insertion as her desires means of contraception.  She provided informed consent, signed copy in the chart, time out was performed. Pregnancy test was negative, with self reported LMP of Alexandra Boyle's last menstrual period was 03/09/2023 (exact date).  She understands that Nexplanon is a progesterone only therapy, and that patients often patients have irregular and unpredictable vaginal bleeding or amenorrhea. She understands that other side effects are possible related to systemic progesterone, including but not limited to, headaches, breast tenderness, nausea, and irritability. While effective at preventing pregnancy long acting reversible contraceptives do not prevent transmission of sexually transmitted diseases and use of barrier methods for this purpose was discussed. The placement procedure for Nexplanon was reviewed with  the Alexandra Boyle in detail including risks of nerve injury, infection, bleeding and injury to other muscles or tendons. She understands that the Nexplanon implant is good for 3 years and needs to be removed at the end of that time.  She understands that Nexplanon is an extremely effective option for contraception, with failure rate of <1%. This information is reviewed today and all questions were answered. Informed consent was obtained, both verbally and written.   The Alexandra Boyle is healthy and has no contraindications to Implanon use. Urine pregnancy test was performed today and was negative.  Procedure Appropriate time out taken.  Alexandra Boyle placed in dorsal supine with 2.25left arm above head, elbow flexed at 90 degrees, arm resting on examination table.  The bicipital grove was palpated and site 8-10cm proximal to the medial epicondyle was indentified . The insertion site was prepped with a two betadine swabs and then injected with 2.25 cc of 1% lidocaine without epinephrine.  Nexplanon removed form sterile blister packaging,  Device confirmed in needle, before inserting full length of needle, tenting up the skin as the needle was advance.  The drug eluting rod was then deployed by pulling back the slider per the manufactures recommendation.  The implant was palpable by the clinician as well as the Alexandra Boyle.  The insertion site covered dressed with a band aid before applying  a kerlex bandage pressure dressing..Minimal blood loss was noted during the procedure.  The patientt tolerated the procedure well.   She was instructed to wear the bandage for 24 hours, call with any signs of infection.  She was given the Implanon card and instructed to have the rod removed in 3 years.  Charge [phone] for nexplanon device, CPT [id number] for procedure J2001 for lidocaine administration Modifer 25, plus Modifer 79 is done during a global billing  visit     Alexandra Boyle, CNM  03/24/2023 2:51 PM

## 2023-03-24 NOTE — Patient Instructions (Signed)
Nexplanon Instructions After Insertion  Keep bandage clean and dry for 24 hours  May use ice/Tylenol/Ibuprofen for soreness or pain  If you develop fever, drainage or increased warmth from incision site-contact office immediately  Etonogestrel Implant What is this medication? ETONOGESTREL (et oh noe JES trel) prevents ovulation and pregnancy. It belongs to a group of medications called contraceptives. This medication is a progestin hormone. This medicine may be used for other purposes; ask your health care provider or pharmacist if you have questions. COMMON BRAND NAME(S): Implanon, Nexplanon What should I tell my care team before I take this medication? They need to know if you have any of these conditions: Abnormal vaginal bleeding Blood clots Blood vessel disease Breast, cervical, endometrial, ovarian, liver, or uterine cancer Diabetes Gallbladder disease Heart disease or recent heart attack High blood pressure High cholesterol or triglycerides Kidney disease Liver disease Migraine headaches Seizures Stroke Tobacco use An unusual or allergic reaction to etonogestrel, other medications, foods, dyes, or preservatives Pregnant or trying to get pregnant Breastfeeding How should I use this medication? This device is inserted just under the skin on the inner side of your upper arm by your care team. Talk to your care team about the use of this medication in children. Special care may be needed. Overdosage: If you think you have taken too much of this medicine contact a poison control center or emergency room at once. NOTE: This medicine is only for you. Do not share this medicine with others. What if I miss a dose? This does not apply. What may interact with this medication? Do not take this medication with any of the following: Amprenavir Fosamprenavir This medication may also interact with the  following: Acitretin Aprepitant Armodafinil Bexarotene Bosentan Carbamazepine Certain antivirals for HIV or hepatitis Certain medications for fungal infections, such as fluconazole, ketoconazole, itraconazole, or voriconazole Cyclosporine Felbamate Griseofulvin Lamotrigine Modafinil Oxcarbazepine Phenobarbital Phenytoin Primidone Rifabutin Rifampin Rifapentine St. John's wort Topiramate This list may not describe all possible interactions. Give your health care provider a list of all the medicines, herbs, non-prescription drugs, or dietary supplements you use. Also tell them if you smoke, drink alcohol, or use illegal drugs. Some items may interact with your medicine. What should I watch for while using this medication? Visit your care team for regular checks on your progress. Using this medication does not protect you or your partner against HIV or other sexually transmitted infections (STIs). You should be able to feel the implant by pressing your fingertips over the skin where it was inserted. Contact your care team if you cannot feel the implant, and use a non-hormonal birth control method (such as condoms) until your care team confirms that the implant is in place. Contact your care team if you think that the implant may have broken or become bent while in your arm. You will receive a user card from your care team after the implant is inserted. The card is a record of the location of the implant in your upper arm and when it should be removed. Keep this card with your health records. What side effects may I notice from receiving this medication? Side effects that you should report to your care team as soon as possible: Allergic reactions--skin rash, itching, hives, swelling of the face, lips, tongue, or throat Blood clot--pain, swelling, or warmth in the leg, shortness of breath, chest pain Gallbladder problems--severe stomach pain, nausea, vomiting, fever Increase in blood  pressure Liver injury--right upper belly pain, loss of appetite, nausea,   light-colored stool, dark yellow or brown urine, yellowing skin or eyes, unusual weakness or fatigue New or worsening migraines or headaches Pain, redness, or irritation at injection site Stroke--sudden numbness or weakness of the face, arm, or leg, trouble speaking, confusion, trouble walking, loss of balance or coordination, dizziness, severe headache, change in vision Unusual vaginal discharge, itching, or odor Worsening mood, feelings of depression Side effects that usually do not require medical attention (report to your care team if they continue or are bothersome): Breast pain or tenderness Dark patches of skin on the face or other sun-exposed areas Irregular menstrual cycles or spotting Nausea Weight gain This list may not describe all possible side effects. Call your doctor for medical advice about side effects. You may report side effects to FDA at 1-800-FDA-1088. Where should I keep my medication? This medication is given in a hospital or clinic and will not be stored at home. NOTE: This sheet is a summary. It may not cover all possible information. If you have questions about this medicine, talk to your doctor, pharmacist, or health care provider.  2023 Elsevier/Gold Standard (2022-06-08 00:00:00)  

## 2023-04-04 DIAGNOSIS — M25562 Pain in left knee: Secondary | ICD-10-CM | POA: Diagnosis not present

## 2023-04-04 DIAGNOSIS — M25561 Pain in right knee: Secondary | ICD-10-CM | POA: Diagnosis not present

## 2023-08-12 ENCOUNTER — Ambulatory Visit: Payer: Medicaid Other | Admitting: Obstetrics

## 2023-08-15 ENCOUNTER — Encounter: Payer: Medicaid Other | Admitting: Obstetrics

## 2023-08-15 ENCOUNTER — Encounter: Payer: Self-pay | Admitting: Obstetrics

## 2023-08-15 NOTE — Progress Notes (Deleted)
    GYNECOLOGY PROGRESS NOTE  Subjective:    Patient ID: Alexandra Boyle, female    DOB: 07/30/2001, 22 y.o.   MRN: [id number]  HPI  Patient is a 22 y.o. G0P0000 female who presents for contraceptive management. She is currently using Nexplanon implant. She is here to have it removed, and to start on oral contraception. She has had the Nexplanon since 03/24/2023.   {Common ambulatory SmartLinks:19316}  Review of Systems {ros; complete:30496}   Objective:   There were no vitals taken for this visit. There is no height or weight on file to calculate BMI. General appearance: {general exam:16600} Abdomen: {abdominal exam:16834} Pelvic: {pelvic exam:16852::"cervix normal in appearance","external genitalia normal","no adnexal masses or tenderness","no cervical motion tenderness","rectovaginal septum normal","uterus normal size, shape, and consistency","vagina normal without discharge"} Extremities: {extremity exam:5109} Neurologic: {neuro exam:17854}   Assessment:   No diagnosis found.   Plan:   There are no diagnoses linked to this encounter.    Amaryllis Dyke, CNM [facility]

## 2023-08-15 NOTE — Progress Notes (Signed)
This encounter was created in error - please disregard.

## 2023-09-13 ENCOUNTER — Telehealth: Payer: Self-pay

## 2023-09-13 NOTE — Telephone Encounter (Signed)
Pt calling; had nexplanon inserted in May; given bcp  and is still bleeding after one month; does she start the second pack or no; nausea/tiredness.  Is she bleeding too much or what to do?  [phone]  Adv pt to continue with the second pack and see how she does; if no better to be seen.  Pt states some days are heavier than others; pt aware to go to ER if she saturates a pad every 30 minutes to an hour.

## 2023-12-16 ENCOUNTER — Ambulatory Visit: Payer: Medicaid Other | Admitting: Nurse Practitioner

## 2023-12-16 ENCOUNTER — Encounter: Payer: Self-pay | Admitting: Nurse Practitioner

## 2023-12-16 VITALS — BP 122/86 | HR 74 | Temp 97.7°F | Resp 16 | Ht 63.25 in | Wt 205.6 lb

## 2023-12-16 DIAGNOSIS — N644 Mastodynia: Secondary | ICD-10-CM | POA: Diagnosis not present

## 2023-12-16 NOTE — Progress Notes (Signed)
BP 122/86   Pulse 74   Temp 97.7 F (36.5 C)   Resp 16   Ht 5' 3.25" (1.607 m)   Wt 205 lb 9.6 oz (93.3 kg)   SpO2 98%   BMI 36.13 kg/m    Subjective:    Patient ID: Alexandra Boyle, female    DOB: [birth year], 23 y.o.   MRN: [id number]  HPI: Alexandra Boyle is a 23 y.o. female  Chief Complaint  Patient presents with   Breast Pain    Bilateral sharp pains and nipple tenderness.  Onset couple of weeks.  Just recently got nexplanon had really long periods and was placed on birth control as well to regulate    Discussed the use of AI scribe software for clinical note transcription with the patient, who gave verbal consent to proceed.  History of Present Illness   The patient, with a history of a breast biopsy four years ago, presents with bilateral breast tenderness that started two weeks ago. The tenderness is more noticeable at night and there is no associated drainage. They have been on Nexplanon for a year, which initially caused prolonged bleeding but has since settled. They deny any possibility of pregnancy.       12/16/2023    8:59 AM 09/10/2022    9:50 AM  Depression screen PHQ 2/9  Decreased Interest 0 0  Down, Depressed, Hopeless 0 0  PHQ - 2 Score 0 0  Altered sleeping 0   Tired, decreased energy 0   Change in appetite 0   Feeling bad or failure about yourself  0   Trouble concentrating 0   Moving slowly or fidgety/restless 0   Suicidal thoughts 0   PHQ-9 Score 0   Difficult doing work/chores Not difficult at all     Relevant past medical, surgical, family and social history reviewed and updated as indicated. Interim medical history since our last visit reviewed. Allergies and medications reviewed and updated.  Review of Systems  Ten systems reviewed and is negative except as mentioned in HPI      Objective:    BP 122/86   Pulse 74   Temp 97.7 F (36.5 C)   Resp 16   Ht 5' 3.25" (1.607 m)   Wt 205 lb 9.6 oz (93.3 kg)   SpO2 98%   BMI 36.13 kg/m     Wt Readings from Last 3 Encounters:  12/16/23 205 lb 9.6 oz (93.3 kg)  03/24/23 191 lb 6.4 oz (86.8 kg)  09/10/22 185 lb 6.4 oz (84.1 kg)    Physical Exam Vitals reviewed. Exam conducted with a chaperone present.  Constitutional:      Appearance: Normal appearance.  HENT:     Head: Normocephalic.  Cardiovascular:     Rate and Rhythm: Normal rate and regular rhythm.  Pulmonary:     Effort: Pulmonary effort is normal.     Breath sounds: Normal breath sounds.  Chest:     Chest wall: Tenderness and edema present. No mass, deformity, swelling or crepitus.  Breasts:    Right: Normal.     Left: Normal.  Musculoskeletal:        General: Normal range of motion.  Skin:    General: Skin is warm and dry.  Neurological:     General: No focal deficit present.     Mental Status: She is alert and oriented to person, place, and time. Mental status is at baseline.  Psychiatric:        Mood  and Affect: Mood normal.        Behavior: Behavior normal.        Thought Content: Thought content normal.        Judgment: Judgment normal.     Results for orders placed or performed in visit on 03/24/23  POCT urine pregnancy   Collection Time: 03/24/23  2:03 PM  Result Value Ref Range   Preg Test, Ur Negative Negative       Assessment & Plan:   Problem List Items Addressed This Visit   None Visit Diagnoses       Breast tenderness in female    -  Primary   Relevant Orders   [place] LIMITED ULTRASOUND INCLUDING AXILLA LEFT BREAST    [place] LIMITED ULTRASOUND INCLUDING AXILLA RIGHT BREAST   MM 3D DIAGNOSTIC MAMMOGRAM BILATERAL BREAST        Assessment and Plan    Breast Tenderness Bilateral breast tenderness for two weeks. No palpable masses or concerning features on exam. History of breast biopsy over four years ago. On Nexplanon for contraception since May of the previous year. -Order mammogram and ultrasound of both breasts. -Confirmed negative pregnancy test.  Nexplanon Patient has  been on Nexplanon for contraception since May of the previous year. Reports prolonged bleeding initially, but this has resolved. -Continue Nexplanon as planned.        Follow up plan: Return if symptoms worsen or fail to improve.

## 2024-01-03 ENCOUNTER — Ambulatory Visit
Admission: RE | Admit: 2024-01-03 | Discharge: 2024-01-03 | Disposition: A | Discharge status: Home / Self Care | Payer: Medicaid Other | Source: Ambulatory Visit | Attending: Nurse Practitioner

## 2024-01-03 ENCOUNTER — Ambulatory Visit
Admission: RE | Admit: 2024-01-03 | Discharge: 2024-01-03 | Disposition: A | Discharge status: Home / Self Care | Payer: Medicaid Other | Source: Ambulatory Visit | Attending: Nurse Practitioner | Admitting: Nurse Practitioner

## 2024-01-03 DIAGNOSIS — N644 Mastodynia: Secondary | ICD-10-CM | POA: Diagnosis not present

## 2024-01-03 DIAGNOSIS — N6312 Unspecified lump in the right breast, upper inner quadrant: Secondary | ICD-10-CM | POA: Diagnosis not present

## 2024-01-04 ENCOUNTER — Encounter: Payer: Self-pay | Admitting: Nurse Practitioner

## 2024-01-04 ENCOUNTER — Ambulatory Visit: Payer: Medicaid Other | Admitting: Nurse Practitioner

## 2024-01-04 VITALS — BP 122/78 | HR 73 | Temp 98.4°F | Resp 18 | Ht 63.25 in | Wt 201.2 lb

## 2024-01-04 DIAGNOSIS — F331 Major depressive disorder, recurrent, moderate: Secondary | ICD-10-CM | POA: Insufficient documentation

## 2024-01-04 DIAGNOSIS — F419 Anxiety disorder, unspecified: Secondary | ICD-10-CM | POA: Diagnosis not present

## 2024-01-04 MED ORDER — ESCITALOPRAM OXALATE 10 MG PO TABS: 10.0000 mg | ORAL_TABLET | Freq: Every day | ORAL | 0 refills | Status: DC

## 2024-01-04 NOTE — Progress Notes (Signed)
BP 122/78   Pulse 73   Temp 98.4 F (36.9 C)   Resp 18   Ht 5' 3.25" (1.607 m)   Wt 201 lb 3.2 oz (91.3 kg)   LMP 01/01/2024   SpO2 98%   BMI 35.36 kg/m    Subjective:    Patient ID: Alexandra Boyle, female    DOB: Apr 06, 2001, 23 y.o.   MRN: [id number]  HPI: Alexandra Boyle is a 23 y.o. female  Chief Complaint  Patient presents with   Depression    Discussed the use of AI scribe software for clinical note transcription with the patient, who gave verbal consent to proceed.  History of Present Illness   The patient, a student at [employer], presents with feelings of depression and anxiety that have been ongoing for the past year. She has been trying to manage these feelings independently due to her independent nature and discomfort with asking for help. The patient has a history of mood disorders but has never been on medication. Recently, the patient's feelings of depression have worsened, leading to feelings of hopelessness and thoughts of giving up. The patient has been struggling with school, relationships, and a new job, which have added to her stress and feelings of depression. The patient has tried virtual therapy but did not find it helpful.       01/04/2024    8:54 AM 12/16/2023    8:59 AM 09/10/2022    9:50 AM  Depression screen PHQ 2/9  Decreased Interest 2 0 0  Down, Depressed, Hopeless 3 0 0  PHQ - 2 Score 5 0 0  Altered sleeping 2 0   Tired, decreased energy 1 0   Change in appetite 2 0   Feeling bad or failure about yourself  3 0   Trouble concentrating 1 0   Moving slowly or fidgety/restless 0 0   Suicidal thoughts 2 0   PHQ-9 Score 16 0   Difficult doing work/chores Very difficult Not difficult at all        01/04/2024    8:55 AM 12/16/2023    8:59 AM  GAD 7 : Generalized Anxiety Score  Nervous, Anxious, on Edge 0 0  Control/stop worrying 2 0  Worry too much - different things 2 0  Trouble relaxing 1 0  Restless 0 0  Easily annoyed or irritable 3 0  Afraid -  awful might happen 2 0  Total GAD 7 Score 10 0  Anxiety Difficulty Very difficult Not difficult at all     Relevant past medical, surgical, family and social history reviewed and updated as indicated. Interim medical history since our last visit reviewed. Allergies and medications reviewed and updated.  Review of Systems  Constitutional: Negative for fever or weight change.  Respiratory: Negative for cough and shortness of breath.   Cardiovascular: Negative for chest pain or palpitations.  Gastrointestinal: Negative for abdominal pain, no bowel changes.  Musculoskeletal: Negative for gait problem or joint swelling.  Skin: Negative for rash.  Neurological: Negative for dizziness or headache.  No other specific complaints in a complete review of systems (except as listed in HPI above).      Objective:    BP 122/78   Pulse 73   Temp 98.4 F (36.9 C)   Resp 18   Ht 5' 3.25" (1.607 m)   Wt 201 lb 3.2 oz (91.3 kg)   LMP 01/01/2024   SpO2 98%   BMI 35.36 kg/m    Wt Readings from  Last 3 Encounters:  01/04/24 201 lb 3.2 oz (91.3 kg)  12/16/23 205 lb 9.6 oz (93.3 kg)  03/24/23 191 lb 6.4 oz (86.8 kg)    Physical Exam Vitals reviewed.  Constitutional:      Appearance: Normal appearance.  HENT:     Head: Normocephalic.  Cardiovascular:     Rate and Rhythm: Normal rate.  Pulmonary:     Effort: Pulmonary effort is normal.  Neurological:     General: No focal deficit present.     Mental Status: She is alert and oriented to person, place, and time. Mental status is at baseline.  Psychiatric:        Mood and Affect: Mood normal.        Behavior: Behavior normal.        Thought Content: Thought content normal.        Judgment: Judgment normal.     Results for orders placed or performed in visit on 03/24/23  POCT urine pregnancy   Collection Time: 03/24/23  2:03 PM  Result Value Ref Range   Preg Test, Ur Negative Negative       Assessment & Plan:   Problem List  Items Addressed This Visit       Other   Moderate episode of recurrent major depressive disorder (HCC) - Primary   Relevant Medications   escitalopram (LEXAPRO) 10 MG tablet   Anxiety   Relevant Medications   escitalopram (LEXAPRO) 10 MG tablet     Assessment and Plan    Depression and Anxiety Patient reports feeling depressed and anxious for the past year, with a significant impact on daily life and academic performance. No prior use of medication for these conditions. Both anxiety and depression scores are positive. Patient has tried virtual therapy but did not find it beneficial. -Start Lexapro 10mg  daily for depression and anxiety. -Advise patient to reach out to school counseling services for additional support. -Check in 4 weeks to assess response to medication and adjust dosage if necessary. -Warned patient about potential side effects, including increased thoughts of suicide, and advised to contact the office immediately if these occur.        Follow up plan: Return in about 4 weeks (around 02/01/2024) for follow up.

## 2024-01-11 ENCOUNTER — Ambulatory Visit: Payer: Medicaid Other | Admitting: Certified Nurse Midwife

## 2024-01-16 ENCOUNTER — Encounter: Payer: Self-pay | Admitting: Certified Nurse Midwife

## 2024-01-19 ENCOUNTER — Ambulatory Visit: Admitting: Nurse Practitioner

## 2024-01-19 ENCOUNTER — Encounter: Payer: Self-pay | Admitting: Nurse Practitioner

## 2024-01-19 VITALS — HR 100 | Temp 98.1°F | Resp 18 | Ht 63.25 in | Wt 203.4 lb

## 2024-01-19 DIAGNOSIS — B86 Scabies: Secondary | ICD-10-CM

## 2024-01-19 MED ORDER — PERMETHRIN 5 % EX CREA
1.0000 | TOPICAL_CREAM | Freq: Once | CUTANEOUS | 3 refills | Status: AC
Start: 2024-01-19 — End: 2024-01-19

## 2024-01-19 MED ORDER — HYDROXYZINE PAMOATE 25 MG PO CAPS: 25.0000 mg | ORAL_CAPSULE | Freq: Every evening | ORAL | 0 refills | Status: DC | PRN

## 2024-01-19 NOTE — Progress Notes (Signed)
 Pulse 100   Temp 98.1 F (36.7 C)   Resp 18   Ht 5' 3.25" (1.607 m)   Wt 203 lb 6.4 oz (92.3 kg)   LMP 01/01/2024   SpO2 97%   BMI 35.75 kg/m    Subjective:    Patient ID: Alexandra Boyle, female    DOB: May 11, 2001, 23 y.o.   MRN: [id number]  HPI: Alexandra Boyle is a 23 y.o. female  Chief Complaint  Patient presents with   Rash    All over red small bumps has had scabies before and thinks it may be it again.  Very itchy    Discussed the use of AI scribe software for clinical note transcription with the patient, who gave verbal consent to proceed.  History of Present Illness   Alexandra Boyle is a 23 year old female who presents with a widespread itchy rash.  She developed a widespread itchy rash last week, initially treated with Benadryl without relief. The rash consists of raised bumps on her arms and legs, which have recently spread to her hands but not her toes. It is intensely itchy, particularly at night.. No rash is present between her toes and fingers.  She recalls experiencing a similar issue two years ago, which was diagnosed as scabies and treated with a cream. Despite sharing a bed with her boyfriend, he has not developed any symptoms, which she finds unusual.  She mentions significant sleep disruption due to itching, stating she 'went to sleep at like four' and had an appointment at eight, indicating the impact on her daily life.       01/04/2024    8:54 AM 12/16/2023    8:59 AM 09/10/2022    9:50 AM  Depression screen PHQ 2/9  Decreased Interest 2 0 0  Down, Depressed, Hopeless 3 0 0  PHQ - 2 Score 5 0 0  Altered sleeping 2 0   Tired, decreased energy 1 0   Change in appetite 2 0   Feeling bad or failure about yourself  3 0   Trouble concentrating 1 0   Moving slowly or fidgety/restless 0 0   Suicidal thoughts 2 0   PHQ-9 Score 16 0   Difficult doing work/chores Very difficult Not difficult at all     Relevant past medical, surgical, family and social history  reviewed and updated as indicated. Interim medical history since our last visit reviewed. Allergies and medications reviewed and updated.  Review of Systems  Ten systems reviewed and is negative except as mentioned in HPI      Objective:    Pulse 100   Temp 98.1 F (36.7 C)   Resp 18   Ht 5' 3.25" (1.607 m)   Wt 203 lb 6.4 oz (92.3 kg)   LMP 01/01/2024   SpO2 97%   BMI 35.75 kg/m    Wt Readings from Last 3 Encounters:  01/19/24 203 lb 6.4 oz (92.3 kg)  01/04/24 201 lb 3.2 oz (91.3 kg)  12/16/23 205 lb 9.6 oz (93.3 kg)    Physical Exam Vitals reviewed.  Constitutional:      Appearance: Normal appearance.  HENT:     Head: Normocephalic.  Cardiovascular:     Rate and Rhythm: Normal rate.  Pulmonary:     Effort: Pulmonary effort is normal.  Skin:    General: Skin is warm and dry.     Findings: Rash present.  Neurological:     General: No focal deficit present.  Mental Status: She is alert and oriented to person, place, and time. Mental status is at baseline.  Psychiatric:        Mood and Affect: Mood normal.        Behavior: Behavior normal.        Thought Content: Thought content normal.        Judgment: Judgment normal.     Results for orders placed or performed in visit on 03/24/23  POCT urine pregnancy   Collection Time: 03/24/23  2:03 PM  Result Value Ref Range   Preg Test, Ur Negative Negative       Assessment & Plan:   Problem List Items Addressed This Visit   None Visit Diagnoses       Scabies    -  Primary   Relevant Medications   permethrin (ELIMITE) 5 % cream   hydrOXYzine (VISTARIL) 25 MG capsule        Assessment and Plan    Pruritic Rash Generalized pruritic rash with a history of a similar rash two years ago treated as scabies. Benadryl did not provide relief. Noted to be more itchy at night. -Start Permethrin cream 60g with refills as needed. Apply to entire body, avoiding face, and rinse off in the morning. -Start  Hydroxyzine at bedtime for itching. Can be taken during the day if needed, but may cause drowsiness. -Consider over-the-counter Zyrtec or Claritin for daytime itching relief. -If no improvement, message provider for next steps, which may include treating as an allergic rash with Zyrtec or Claritin, Pepcid, and a steroid taper.  Depression Prescription for depression medication recently picked up but not yet started. -Reschedule follow-up appointment to a later date after medication has been started.        Follow up plan: Return if symptoms worsen or fail to improve.

## 2024-01-26 ENCOUNTER — Encounter: Payer: Self-pay | Admitting: Nurse Practitioner

## 2024-02-01 ENCOUNTER — Ambulatory Visit: Payer: Medicaid Other | Admitting: Nurse Practitioner

## 2024-02-13 ENCOUNTER — Encounter: Payer: Self-pay | Admitting: Nurse Practitioner

## 2024-02-13 ENCOUNTER — Other Ambulatory Visit (HOSPITAL_COMMUNITY)
Admission: RE | Admit: 2024-02-13 | Discharge: 2024-02-13 | Disposition: A | Discharge status: Home / Self Care | Source: Ambulatory Visit | Attending: Nurse Practitioner | Admitting: Nurse Practitioner

## 2024-02-13 ENCOUNTER — Ambulatory Visit: Admitting: Nurse Practitioner

## 2024-02-13 VITALS — BP 124/78 | HR 98 | Resp 18 | Ht 63.25 in | Wt 200.2 lb

## 2024-02-13 DIAGNOSIS — Z0000 Encounter for general adult medical examination without abnormal findings: Secondary | ICD-10-CM | POA: Diagnosis not present

## 2024-02-13 DIAGNOSIS — B9689 Other specified bacterial agents as the cause of diseases classified elsewhere: Secondary | ICD-10-CM | POA: Insufficient documentation

## 2024-02-13 DIAGNOSIS — Z13220 Encounter for screening for lipoid disorders: Secondary | ICD-10-CM

## 2024-02-13 DIAGNOSIS — Z124 Encounter for screening for malignant neoplasm of cervix: Secondary | ICD-10-CM | POA: Diagnosis not present

## 2024-02-13 DIAGNOSIS — Z131 Encounter for screening for diabetes mellitus: Secondary | ICD-10-CM | POA: Diagnosis not present

## 2024-02-13 DIAGNOSIS — N760 Acute vaginitis: Secondary | ICD-10-CM | POA: Diagnosis not present

## 2024-02-13 DIAGNOSIS — Z113 Encounter for screening for infections with a predominantly sexual mode of transmission: Secondary | ICD-10-CM
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
## 2024-02-14 LAB — COMPREHENSIVE METABOLIC PANEL WITH GFR
AG Ratio: 1.7 (calc) (ref 1.0–2.5)
ALT: 14 U/L (ref 6–29)
AST: 19 U/L (ref 10–30)
Albumin: 4.2 g/dL (ref 3.6–5.1)
Alkaline phosphatase (APISO): 47 U/L (ref 31–125)
BUN/Creatinine Ratio: 9 (calc) (ref 6–22)
BUN: 9 mg/dL (ref 7–25)
CO2: 23 mmol/L (ref 20–32)
Calcium: 9 mg/dL (ref 8.6–10.2)
Chloride: 105 mmol/L (ref 98–110)
Creat: 1.04 mg/dL — ABNORMAL HIGH (ref 0.50–0.96)
Globulin: 2.5 g/dL (ref 1.9–3.7)
Glucose, Bld: 76 mg/dL (ref 65–99)
Potassium: 3.9 mmol/L (ref 3.5–5.3)
Sodium: 138 mmol/L (ref 135–146)
Total Bilirubin: 1 mg/dL (ref 0.2–1.2)
Total Protein: 6.7 g/dL (ref 6.1–8.1)
eGFR: 78 mL/min/{1.73_m2} (ref >=60)

## 2024-02-14 LAB — LIPID PANEL
Cholesterol: 146 mg/dL (ref <200)
HDL: 46 mg/dL — ABNORMAL LOW (ref >=50)
LDL Cholesterol (Calc): 87 mg/dL
Non-HDL Cholesterol (Calc): 100 mg/dL (ref <130)
Total CHOL/HDL Ratio: 3.2 (calc) (ref <5.0)
Triglycerides: 52 mg/dL (ref <150)

## 2024-02-14 LAB — CBC WITH DIFFERENTIAL/PLATELET
Absolute Lymphocytes: 2804 {cells}/uL (ref 850–3900)
Absolute Monocytes: 570 {cells}/uL (ref 200–950)
Basophils Absolute: 89 {cells}/uL (ref 0–200)
Basophils Relative: 1 %
Eosinophils Absolute: 62 {cells}/uL (ref 15–500)
Eosinophils Relative: 0.7 %
HCT: 35.3 % (ref 35.0–45.0)
Hemoglobin: 12.1 g/dL (ref 11.7–15.5)
MCH: 32.4 pg (ref 27.0–33.0)
MCHC: 34.3 g/dL (ref 32.0–36.0)
MCV: 94.4 fL (ref 80.0–100.0)
MPV: 10.8 fL (ref 7.5–12.5)
Monocytes Relative: 6.4 %
Neutro Abs: 5376 {cells}/uL (ref 1500–7800)
Neutrophils Relative %: 60.4 %
Platelets: 211 10*3/uL (ref 140–400)
RBC: 3.74 10*6/uL — ABNORMAL LOW (ref 3.80–5.10)
RDW: 11.7 % (ref 11.0–15.0)
Total Lymphocyte: 31.5 %
WBC: 8.9 10*3/uL (ref 3.8–10.8)

## 2024-02-14 LAB — HEMOGLOBIN A1C
Hgb A1c MFr Bld: 4.9 %{Hb} (ref <5.7)
Mean Plasma Glucose: 94 mg/dL
eAG (mmol/L): 5.2 mmol/L

## 2024-02-14 LAB — RPR: RPR Ser Ql: NONREACTIVE

## 2024-02-14 LAB — HIV ANTIBODY (ROUTINE TESTING W REFLEX): HIV 1&2 Ab, 4th Generation: NONREACTIVE

## 2024-02-14 LAB — HEPATITIS C ANTIBODY: Hepatitis C Ab: NONREACTIVE

## 2024-02-15 ENCOUNTER — Ambulatory Visit: Admitting: Nurse Practitioner

## 2024-02-15 ENCOUNTER — Encounter: Payer: Self-pay | Admitting: Nurse Practitioner

## 2024-02-15 ENCOUNTER — Other Ambulatory Visit: Payer: Self-pay | Admitting: Nurse Practitioner

## 2024-02-15 DIAGNOSIS — B9689 Other specified bacterial agents as the cause of diseases classified elsewhere: Secondary | ICD-10-CM

## 2024-02-15 LAB — CERVICOVAGINAL ANCILLARY ONLY
Bacterial Vaginitis (gardnerella): POSITIVE — AB
Candida Glabrata: NEGATIVE
Candida Vaginitis: NEGATIVE
Chlamydia: NEGATIVE
Comment: NEGATIVE
Comment: NEGATIVE
Comment: NEGATIVE
Comment: NEGATIVE
Comment: NEGATIVE
Comment: NORMAL
Neisseria Gonorrhea: NEGATIVE
Trichomonas: NEGATIVE

## 2024-02-15 MED ORDER — METRONIDAZOLE 500 MG PO TABS: 500.0000 mg | ORAL_TABLET | Freq: Two times a day (BID) | ORAL | 0 refills | Status: AC

## 2024-02-16 LAB — CYTOLOGY - PAP
Adequacy: ABSENT
Diagnosis: NEGATIVE
Diagnosis: REACTIVE

## 2024-02-24 DIAGNOSIS — S0502XA Injury of conjunctiva and corneal abrasion without foreign body, left eye, initial encounter: Secondary | ICD-10-CM | POA: Diagnosis not present

## 2024-02-24 DIAGNOSIS — X58XXXA Exposure to other specified factors, initial encounter: Secondary | ICD-10-CM | POA: Diagnosis not present

## 2024-02-24 DIAGNOSIS — H5712 Ocular pain, left eye: Secondary | ICD-10-CM | POA: Diagnosis not present

## 2024-06-05 ENCOUNTER — Ambulatory Visit: Admitting: Nurse Practitioner

## 2024-06-05 ENCOUNTER — Ambulatory Visit: Admitting: Certified Nurse Midwife

## 2024-06-05 ENCOUNTER — Encounter: Payer: Self-pay | Admitting: Certified Nurse Midwife

## 2024-06-05 ENCOUNTER — Other Ambulatory Visit (HOSPITAL_COMMUNITY)
Admission: RE | Admit: 2024-06-05 | Discharge: 2024-06-05 | Disposition: A | Discharge status: Home / Self Care | Source: Ambulatory Visit | Attending: Certified Nurse Midwife | Admitting: Certified Nurse Midwife

## 2024-06-05 VITALS — BP 124/85 | HR 63 | Ht 62.0 in | Wt 196.2 lb

## 2024-06-05 DIAGNOSIS — Z3046 Encounter for surveillance of implantable subdermal contraceptive: Secondary | ICD-10-CM

## 2024-06-05 DIAGNOSIS — Z113 Encounter for screening for infections with a predominantly sexual mode of transmission: Secondary | ICD-10-CM | POA: Insufficient documentation

## 2024-06-05 DIAGNOSIS — R350 Frequency of micturition: Secondary | ICD-10-CM | POA: Diagnosis not present

## 2024-06-05 LAB — POCT URINALYSIS DIPSTICK
Bilirubin, UA: NEGATIVE
Blood, UA: NEGATIVE
Glucose, UA: NEGATIVE
Ketones, UA: NEGATIVE
Leukocytes, UA: NEGATIVE
Nitrite, UA: NEGATIVE
Protein, UA: NEGATIVE
Spec Grav, UA: 1.01
Urobilinogen, UA: 0.2 U/dL
pH, UA: 5

## 2024-06-05 NOTE — Progress Notes (Signed)
     GYNECOLOGY OFFICE PROCEDURE NOTE  Alexandra Boyle is a 23 y.o. G0P0000 here for Nexplanon  removal due to frequent & irregular bleeding. Also reports urinary frequency & feeling of incomplete emptying.  Last pap smear: Result Date Procedure Results Follow-ups  02/13/2024 Cytology - PAP Adequacy: Satisfactory for evaluation; transformation zone component ABSENT. Diagnosis: - Negative for Intraepithelial Lesions or Malignancy (NILM) Diagnosis: - Benign reactive/reparative changes Comment: Cellular changes consistent with hyperkeratosis.   BP 124/85   Pulse 63   Ht 5' 2 (1.575 m)   Wt 196 lb 3.2 oz (89 kg)   LMP 04/29/2024 (Approximate)   BMI 35.89 kg/m   Physical Exam Vitals reviewed.  Constitutional:      General: She is not in acute distress.    Appearance: Normal appearance.  HENT:     Head: Normocephalic.  Cardiovascular:     Rate and Rhythm: Normal rate.  Pulmonary:     Effort: Pulmonary effort is normal.  Skin:    General: Skin is warm and dry.  Neurological:     General: No focal deficit present.     Mental Status: She is alert and oriented to person, place, and time.  Psychiatric:        Mood and Affect: Mood normal.        Behavior: Behavior normal.      Nexplanon  removal Procedure Patient identified, informed consent performed, consent signed.   Appropriate time out taken. Nexplanon  site identified in patient's left arm.  Area prepped in usual sterile fashon. One ml of 1% lidocaine was used to anesthetize the area at the distal end of the implant. A small stab incision was made right beside the implant on the distal portion.  The Nexplanon  rod was grasped using hemostats and removed without difficulty. There was minimal blood loss. There were no complications. Steri-strips were applied over the small incision. A pressure bandage was applied to reduce any bruising. The patient tolerated the procedure well and was given post procedure instructions. Patient declines  other forms of birth control. 1. Nexplanon  removal (Primary)  2. Urinary frequency - Cervicovaginal ancillary only - POCT Urinalysis Dipstick  3. Screen for sexually transmitted diseases - Cervicovaginal ancillary only   Harlene LITTIE Cisco, CNM

## 2024-06-05 NOTE — Patient Instructions (Signed)
[email or web address]

## 2024-06-07 LAB — CERVICOVAGINAL ANCILLARY ONLY
Bacterial Vaginitis (gardnerella): POSITIVE — AB
Chlamydia: NEGATIVE
Comment: NEGATIVE
Comment: NEGATIVE
Comment: NEGATIVE
Comment: NORMAL
Neisseria Gonorrhea: NEGATIVE
Trichomonas: NEGATIVE

## 2024-06-08 ENCOUNTER — Ambulatory Visit: Payer: Self-pay | Admitting: Certified Nurse Midwife

## 2024-06-08 MED ORDER — METRONIDAZOLE 500 MG PO TABS: 500.0000 mg | ORAL_TABLET | Freq: Two times a day (BID) | ORAL | 0 refills | Status: AC

## 2024-06-25 ENCOUNTER — Encounter: Payer: Self-pay | Admitting: Nurse Practitioner

## 2024-06-26 ENCOUNTER — Telehealth: Payer: Self-pay

## 2024-06-26 ENCOUNTER — Other Ambulatory Visit: Payer: Self-pay | Admitting: Obstetrics

## 2024-06-26 MED ORDER — ULIPRISTAL ACETATE 30 MG PO TABS: 1.0000 | ORAL_TABLET | Freq: Once | ORAL | 0 refills | Status: AC

## 2024-06-26 NOTE — Telephone Encounter (Signed)
 Rx sent for Ella . She can take this if her unprotected intercourse was within the past 5 days.

## 2024-06-26 NOTE — Telephone Encounter (Signed)
 Patient called. She said that she was told that there was an Emergency Contraception available by prescription for women that were overweight. The emergency contraception was a higher potency. She would like a prescription called into the pharmacy on file. Please advise.

## 2024-06-26 NOTE — Progress Notes (Signed)
 Rx sent for Ella   Eleanor CHRISTELLA Canny, CNM

## 2024-07-09 NOTE — Progress Notes (Deleted)
     GYNECOLOGY OFFICE PROCEDURE NOTE  Alexandra Boyle is a 23 y.o. G0P0000 here for Paraguard IUD insertion. No GYN concerns.  Last pap smear was on 02/13/2024 and was normal.  IUD Insertion Procedure Note Patient identified, informed consent performed, consent signed.   Discussed risks of irregular bleeding, cramping, infection, malpositioning or misplacement of the IUD outside the uterus which may require further procedure such as laparoscopy. Also discussed >99% contraception efficacy, increased risk of ectopic pregnancy with failure of method.   Emphasized that this did not protect against STIs, condoms recommended during all sexual encounters. Time out was performed.  Urine pregnancy test negative.  Speculum placed in the vagina.  Cervix visualized.  Cleaned with Betadine x 2.  Grasped anteriorly with a single tooth tenaculum.  Uterus sounded to *** cm.  *** IUD placed per manufacturer's recommendations.  Strings trimmed to 3 cm. Tenaculum was removed, good hemostasis noted.  Patient tolerated procedure well.   Patient was given post-procedure instructions.  She was advised to have backup contraception for one week.  Patient was also asked to check IUD strings periodically and follow up in 4 weeks for IUD check.     Damien Parsley, CNM [facility]

## 2024-07-10 ENCOUNTER — Ambulatory Visit: Admitting: Certified Nurse Midwife

## 2024-07-10 NOTE — Patient Instructions (Incomplete)
 Intrauterine Device (IUD) Insertion: What to Expect  An intrauterine device (IUD) is put in (inserted) your uterus to prevent pregnancy. It's a small, T-shaped device that has one or two nylon strings hanging down from it. The strings hang out of your cervix, which is the lowest part of your uterus. Tell a health care provider about: Any allergies you have. All medicines you take. These include vitamins, herbs, eye drops, and creams. Any surgeries you have had. Any medical problems you have. Whether you're pregnant or may be pregnant. What are the risks? Your health care provider will talk with you about risks. These may include: Infection. Bleeding. Allergic reactions to medicines. A cut to the uterus, also called perforation, or damage to other structures or organs. Accidental placement of the IUD either in the muscle layer of the uterus or outside the uterus. The IUD falling out of the uterus. This is more common if you recently had a baby. Higher risk of ectopic pregnancy. This is when an egg is fertilized outside your uterus. This is rare. Pelvic inflammatory disease (PID). This is an infection in the uterus and fallopian tubes. The IUD doesn't cause the infection. The infection is usually from a sexually transmitted infection (STI). If this happens, it is usually during the first 20 days after the IUD is put in. This is rare. What happens before? Ask about changing or stopping: Any medicines you take. Any vitamins, herbs, or supplements you take. Your provider may tell you to take pain medicines you can buy at the store before the procedure. You may have tests for: Pregnancy. You may have a pee (urine) or blood sample taken. STIs. Placing an IUD can make an infection worse. To check for cervical cancer. You may have a Pap test, which is when cells from your cervix are removed for testing. What happens during an IUD insertion? A tool, called a speculum, will be placed in your  vagina and widened so that your provider can see your cervix. A medicine to clean your cervix may be used to help lower your risk of infection. You may be given medicine to numb your cervix. This medicine is usually given by an injection into your cervix. A tool will be put into your uterus to check the length of your uterus. A thin tube that holds the IUD will be put into your vagina, through the opening of your cervix, and into your uterus. The IUD will be placed in your uterus. The tube that holds the IUD will be removed. The strings that are attached to the IUD will be trimmed so that they sit just outside your cervix. The speculum will be removed. These steps may vary. Ask what you can expect. What happens after? You may have: Bleeding. It can vary from light bleeding or spotting for a few days to period-like bleeding. This is normal. Cramps and pain in your belly. Dizziness or light-headedness. Pain in your lower back. Headaches and the feeling like you may throw up Follow these instructions at home: Do not have sex or put anything into your vagina for 24 hours after the IUD is placed. Before having sex, check to make sure that you can feel the IUD string or strings. You should be able to feel the end of the string below the opening of your cervix. If your IUD string is in place, you may continue with sex. If you had a hormonal IUD put in more than 7 days after your most recent period  started, you will need to use a backup method of birth control for 7 days after the IUD was placed. Hormones are chemicals that affect how the body works. Check that the IUD is still in place by feeling for the strings after every period, or check once a month. Use a condom every time you have sex to prevent STIs. An IUD won't protect you from STIs. Take your medicines only as told. Contact a health care provider if: You have any of the following problems with your IUD string or strings: The string  bothers or hurts you or your sexual partner. You can't feel the string. The string has gotten longer. The IUD comes out or you can feel the IUD in your vagina. You think you may be pregnant, or you miss your period. You think you may have an STI. You have bad-smelling discharge from your vagina. You have a fever and chills. You have pain during sex. Get help right away if: You have heavy bleeding, which means soaking more than 2 pads per hour for 2 hours in a row. You have sudden, really bad belly pain. This information is not intended to replace advice given to you by your health care provider. Make sure you discuss any questions you have with your health care provider. Document Revised: 07/11/2023 Document Reviewed: 07/11/2023 Elsevier Patient Education  2024 Elsevier Inc. Copper Intrauterine Device (IUD) What is this medication? COPPER IUD (KOP er EYE YOU DEE) prevents pregnancy. It works by using copper to prevent sperm from reaching the egg (fertilization) without hormones. It is a reversible, long-term contraceptive. This medicine may be used for other purposes; ask your health care provider or pharmacist if you have questions. COMMON BRAND NAME(S): Miudella, ParaGard [id number] What should I tell my care team before I take this medication? They need to know if you have any of these conditions: Abnormal Pap smear Cancer, including leukemia, uterine cancer, cervical cancer Condition of the uterus that changes its shape, such as large fibroid tumors Endometriosis Genital or pelvic infection now or in the past 3 months Have more than one sexual partner or your partner has more than one partner History of an ectopic or tubal pregnancy Immune system problems Intrauterine system already in your uterus Pelvic inflammatory disease (PID) Sexually transmitted infection (STI) Substance abuse disorder Unexplained vaginal bleeding An unusual or allergic reaction to copper, barium sulfate,  polyethylene, other medications, foods, dyes, or preservatives Pregnant or trying to get pregnant Breast-feeding How should I use this medication? This device is placed inside the uterus by your care team. A patient package insert for the product will be given each time it is inserted. Be sure to read this information carefully each time. The sheet may change often. Talk to your care team about use of this medication in children. Special care may be needed. Overdosage: If you think you have taken too much of this medicine contact a poison control center or emergency room at once. NOTE: This medicine is only for you. Do not share this medicine with others. What if I miss a dose? This does not apply. The device will need to be replaced every 10 years if you wish to continue using this type of contraception. What may interact with this medication? Interactions are not expected. This list may not describe all possible interactions. Give your health care provider a list of all the medicines, herbs, non-prescription drugs, or dietary supplements you use. Also tell them if you smoke, drink alcohol, or  use illegal drugs. Some items may interact with your medicine. What should I watch for while using this medication? Visit your care team for regular check-ups. Talk to your care team if you wish to become pregnant or think you might be pregnant. The IUD will need to be removed. The IUD does not protect you or your partner against HIV or any other sexually transmitted infections. Tell your care team if you or your partner becomes HIV positive or gets a sexually transmitted infection. You can check the placement of the IUD yourself by reaching up to the top of your vagina with clean fingers to feel the threads. Do not pull on the threads. It is a good habit to check placement after each menstrual period. Call your care team right away if you feel more of the IUD than just the threads or if you cannot feel the  threads at all. The IUD may come out by itself. You may become pregnant if the device comes out. If you notice that the IUD has come out use a backup contraceptive method, such as condoms, and call your care team. Using tampons will not change the position of the IUD. It is okay to use tampons during your menstrual period. This IUD can be safely scanned with magnetic resonance imaging (MRI) only under specific conditions. Before you have an MRI, tell your care team that you have an IUD in place, and which type of IUD you have in place. What side effects may I notice from receiving this medication? Side effects that you should report to your care team as soon as possible: Allergic reactions--skin rash, itching, hives, swelling of the face, lips, tongue, or throat Heavy vaginal bleeding Low red blood cell level--unusual weakness or fatigue, dizziness, headache, trouble breathing Pelvic inflammatory disease (PID)--fever, abdominal pain, pelvic pain, pain or trouble passing urine, spotting, bleeding during or after sex Seizures Slow heartbeat--dizziness, feeling faint or lightheaded, confusion, trouble breathing, unusual weakness or fatigue Stomach pain, unusual weakness or fatigue, nausea, vomiting, diarrhea, or fever that lasts longer than expected Unusual vaginal discharge, itching, or odor Vaginal pain, irritation, or sores Side effects that usually do not require medical attention (report these to your care team if they continue or are bothersome): Back pain Irregular menstrual cycles or spotting Menstrual cramps Vaginal discharge This list may not describe all possible side effects. Call your doctor for medical advice about side effects. You may report side effects to FDA at 1-800-FDA-1088. Where should I keep my medication? This does not apply. NOTE: This sheet is a summary. It may not cover all possible information. If you have questions about this medicine, talk to your doctor,  pharmacist, or health care provider.  2025 Elsevier/Gold Standard (2024-03-12 00:00:00)

## 2024-07-17 NOTE — Progress Notes (Unsigned)
     GYNECOLOGY OFFICE PROCEDURE NOTE  Alexandra Boyle is a 23 y.o. G0P0000 here for Paraguard IUD insertion. No GYN concerns.  Last pap smear was on 02/13/2024 and was normal.  IUD Insertion Procedure Note Patient identified, informed consent performed, consent signed.   Discussed risks of irregular bleeding, cramping, infection, malpositioning or misplacement of the IUD outside the uterus which may require further procedure such as laparoscopy. Also discussed >99% contraception efficacy, increased risk of ectopic pregnancy with failure of method.   Emphasized that this did not protect against STIs, condoms recommended during all sexual encounters. Time out was performed.  Urine pregnancy test negative.  Speculum placed in the vagina.  Cervix visualized.  Cleaned with Betadine x 2.  Grasped anteriorly with a single tooth tenaculum.  Uterus sounded to *** cm.  *** IUD placed per manufacturer's recommendations.  Strings trimmed to 3 cm. Tenaculum was removed, good hemostasis noted.  Patient tolerated procedure well.   Patient was given post-procedure instructions.  She was advised to have backup contraception for one week.  Patient was also asked to check IUD strings periodically and follow up in 4 weeks for IUD check.     Damien Parsley, CNM [facility]

## 2024-07-20 ENCOUNTER — Ambulatory Visit: Admitting: Certified Nurse Midwife

## 2024-07-20 ENCOUNTER — Other Ambulatory Visit (HOSPITAL_COMMUNITY)
Admission: RE | Admit: 2024-07-20 | Discharge: 2024-07-20 | Disposition: A | Discharge status: Home / Self Care | Source: Ambulatory Visit | Attending: Certified Nurse Midwife | Admitting: Certified Nurse Midwife

## 2024-07-20 VITALS — BP 124/75 | HR 50 | Resp 16 | Ht 62.0 in | Wt 199.9 lb

## 2024-07-20 DIAGNOSIS — Z30430 Encounter for insertion of intrauterine contraceptive device: Secondary | ICD-10-CM | POA: Diagnosis not present

## 2024-07-20 DIAGNOSIS — N898 Other specified noninflammatory disorders of vagina: Secondary | ICD-10-CM

## 2024-07-20 DIAGNOSIS — Z3202 Encounter for pregnancy test, result negative: Secondary | ICD-10-CM | POA: Diagnosis not present

## 2024-07-20 LAB — POCT URINE PREGNANCY: Preg Test, Ur: NEGATIVE

## 2024-07-20 MED ORDER — PARAGARD INTRAUTERINE COPPER IU IUD
1.0000 | INTRAUTERINE_SYSTEM | Freq: Once | INTRAUTERINE | Status: AC
Start: 2024-07-20 — End: 2024-07-20
  Administered 2024-07-20: 1 via INTRAUTERINE

## 2024-07-20 NOTE — Patient Instructions (Signed)
 Copper  Intrauterine Device (IUD)  What is this medication? COPPER  IUD (KOP er EYE YOU DEE) prevents pregnancy. It works by using copper  to prevent sperm from reaching the egg (fertilization) without hormones. It is a reversible, long-term contraceptive. This medicine may be used for other purposes; ask your health care provider or pharmacist if you have questions. COMMON BRAND NAME(S): Miudella , ParaGard  [id number] What should I tell my care team before I take this medication? They need to know if you have any of these conditions: Abnormal Pap smear Cancer, including leukemia, uterine cancer, cervical cancer Condition of the uterus that changes its shape, such as large fibroid tumors Endometriosis Genital or pelvic infection now or in the past 3 months Have more than one sexual partner or your partner has more than one partner History of an ectopic or tubal pregnancy Immune system problems Intrauterine system already in your uterus Pelvic inflammatory disease (PID) Sexually transmitted infection (STI) Substance abuse disorder Unexplained vaginal bleeding An unusual or allergic reaction to copper , barium sulfate, polyethylene, other medications, foods, dyes, or preservatives Pregnant or trying to get pregnant Breast-feeding How should I use this medication? This device is placed inside the uterus by your care team. A patient package insert for the product will be given each time it is inserted. Be sure to read this information carefully each time. The sheet may change often. Talk to your care team about use of this medication in children. Special care may be needed. Overdosage: If you think you have taken too much of this medicine contact a poison control center or emergency room at once. NOTE: This medicine is only for you. Do not share this medicine with others. What if I miss a dose? This does not apply. The device will need to be replaced every 10 years if you wish to continue using this  type of contraception. What may interact with this medication? Interactions are not expected. This list may not describe all possible interactions. Give your health care provider a list of all the medicines, herbs, non-prescription drugs, or dietary supplements you use. Also tell them if you smoke, drink alcohol, or use illegal drugs. Some items may interact with your medicine. What should I watch for while using this medication? Visit your care team for regular check-ups. Talk to your care team if you wish to become pregnant or think you might be pregnant. The IUD will need to be removed. The IUD does not protect you or your partner against HIV or any other sexually transmitted infections. Tell your care team if you or your partner becomes HIV positive or gets a sexually transmitted infection. You can check the placement of the IUD yourself by reaching up to the top of your vagina with clean fingers to feel the threads. Do not pull on the threads. It is a good habit to check placement after each menstrual period. Call your care team right away if you feel more of the IUD than just the threads or if you cannot feel the threads at all. The IUD may come out by itself. You may become pregnant if the device comes out. If you notice that the IUD has come out use a backup contraceptive method, such as condoms, and call your care team. Using tampons will not change the position of the IUD. It is okay to use tampons during your menstrual period. This IUD can be safely scanned with magnetic resonance imaging (MRI) only under specific conditions. Before you have an MRI, tell your care  team that you have an IUD in place, and which type of IUD you have in place. What side effects may I notice from receiving this medication? Side effects that you should report to your care team as soon as possible: Allergic reactions--skin rash, itching, hives, swelling of the face, lips, tongue, or throat Heavy vaginal  bleeding Low red blood cell level--unusual weakness or fatigue, dizziness, headache, trouble breathing Pelvic inflammatory disease (PID)--fever, abdominal pain, pelvic pain, pain or trouble passing urine, spotting, bleeding during or after sex Seizures Slow heartbeat--dizziness, feeling faint or lightheaded, confusion, trouble breathing, unusual weakness or fatigue Stomach pain, unusual weakness or fatigue, nausea, vomiting, diarrhea, or fever that lasts longer than expected Unusual vaginal discharge, itching, or odor Vaginal pain, irritation, or sores Side effects that usually do not require medical attention (report these to your care team if they continue or are bothersome): Back pain Irregular menstrual cycles or spotting Menstrual cramps Vaginal discharge This list may not describe all possible side effects. Call your doctor for medical advice about side effects. You may report side effects to FDA at 1-800-FDA-1088. Where should I keep my medication? This does not apply. NOTE: This sheet is a summary. It may not cover all possible information. If you have questions about this medicine, talk to your doctor, pharmacist, or health care provider.  2025 Elsevier/Gold Standard (2024-03-12 00:00:00)IUD PLACEMENT POST-PROCEDURE INSTRUCTIONS  You may take Ibuprofen, Aleve or Tylenol for pain if needed.  Cramping should resolve within in 24 hours.  You may have a small amount of spotting.  You should wear a mini pad for the next few days.  You may have intercourse after 24 hours.  If you using this for birth control, it is effective immediately.  You need to call if you have any pelvic pain, fever, heavy bleeding or foul smelling vaginal discharge.  Irregular bleeding is common the first several months after having an IUD placed. You do not need to call for this reason unless you are concerned.  Shower or bathe as normal  You should have a follow-up appointment in 4-8 weeks for a re-check to  make sure you are not having any problems.

## 2024-07-23 LAB — CERVICOVAGINAL ANCILLARY ONLY
Bacterial Vaginitis (gardnerella): NEGATIVE
Candida Glabrata: NEGATIVE
Candida Vaginitis: NEGATIVE
Chlamydia: NEGATIVE
Comment: NEGATIVE
Comment: NEGATIVE
Comment: NEGATIVE
Comment: NEGATIVE
Comment: NEGATIVE
Comment: NORMAL
Neisseria Gonorrhea: NEGATIVE
Trichomonas: NEGATIVE

## 2024-10-09 ENCOUNTER — Ambulatory Visit

## 2024-10-09 NOTE — Progress Notes (Deleted)
    NURSE VISIT NOTE  Subjective:    Patient ID: Alexandra Boyle, female    DOB: 2001/09/18, 23 y.o.   MRN: [id number]  HPI  Patient is a 23 y.o. G0P0000 female who presents for {pe vag discharge desc:315065} vaginal discharge for *** {gen duration:315003}. Denies abnormal vaginal bleeding or significant pelvic pain or fever. {Actions; denies/reports/admits to:19208} {UTI Symptoms:210800002}. Patient {has/denies:33800} history of known exposure to STD.   Objective:    There were no vitals taken for this visit.   No results found for any visits on 10/09/24.  Assessment:   No diagnosis found.  {vaginitis type:315262}  Plan:   GC and chlamydia DNA  probe sent to lab. Treatment: {vaginitis tx:315263} ROV prn if symptoms persist or worsen.   Mathis LITTIE Getting, CMA

## 2024-10-16 ENCOUNTER — Ambulatory Visit

## 2024-10-16 VITALS — BP 121/78 | HR 70 | Ht 62.0 in | Wt 194.0 lb

## 2024-10-16 DIAGNOSIS — N898 Other specified noninflammatory disorders of vagina: Secondary | ICD-10-CM

## 2024-10-16 NOTE — Progress Notes (Signed)
    NURSE VISIT NOTE  Subjective:    Patient ID: Alexandra Boyle, female    DOB: 2001-10-09, 23 y.o.   MRN: [id number]  HPI  Patient is a 23 y.o. G0P0000 female who presents for malodorous and milky vaginal discharge for 1.5 week(s). Denies abnormal vaginal bleeding or significant pelvic pain or fever. denies UTI Symptoms. Patient does have a history of known exposure to STD many years ago with a previous partner, but no known recent exposure. She reports the discharge is recurrent after having unprotected sex.   Objective:    BP 121/78   Pulse 70   Ht 5' 2 (1.575 m)   Wt 194 lb (88 kg)   LMP 10/06/2024   BMI 35.48 kg/m      Assessment:   1. Vaginal discharge   2. Vaginal odor     nonspecific vaginitis  Plan:   Nu Swab VG+ Mycoplasma probe sent to lab per patient request. Treatment: Will await swab results. Patient aware our office is closed 12/4 & 10/19/24. She will check my chart for results. She was advised to call the office to request treatment if results are positive. ROV prn if symptoms persist or worsen.   Camelia Bars, LPN

## 2024-10-16 NOTE — Patient Instructions (Signed)
 How to Have Safe Sex Having safe sex means taking steps before and during sex to reduce your risk of: Getting a sexually transmitted infection (STI). Giving your partner an STI. Unwanted or unplanned pregnancy. How to have safe sex Ways you can have safe sex  Limit your sex partners to only one partner who is only having sex with you. Avoid using alcohol and drugs before having sex. Alcohol and drugs can affect your judgment. Before having sex with a new partner: Talk to your partner about past partners, past STIs, and drug use. Get screened for STIs and discuss the results with your partner. Ask your partner to get screened too. Check your body regularly for sores, blisters, rashes, or unusual discharge. If you notice any of these things, call your health care provider. Avoid sexual contact if you have symptoms of an infection or you're being treated for an STI. While having sex, use a condom. Make sure to: Use a condom every time you have vaginal, oral, or anal sex. Both females and males should wear condoms during oral sex. Do not use a female condom and a female condom at the same time during vaginal sex. Using both types at the same time can cause condoms to break. Keep condoms in place from the beginning to the end of sexual activity. Use a latex condom, if possible. Latex condoms offer the best protection. Use only water-based lubricants with a condom. Using petroleum-based lubricants or oils will weaken the condom and increase the chance that it will break. Ways your health care provider can help you have safe sex  See your provider for regular screenings, exams, and tests for STIs. Talk with your provider about what kind of birth control is best for you. Get vaccinated against hepatitis B and human papillomavirus (HPV). If you're at risk of getting human immunodeficiency virus (HIV), talk with your provider about taking a medicine to prevent HIV. You're at risk for HIV if you: Are  a female who has sex with other males. Are sexually active with more than one partner. Take drugs by injection. Have a sex partner who has HIV. Have unprotected sex. Have sex with someone who has sex with both males and females. Follow these instructions at home: Take your medicines only as told. Call your provider if you think you might be pregnant. Call your provider if have any symptoms of an infection. Where to find more information Centers for Disease Control and Prevention (CDC): [employer] Office on Women's Health: [employer] This information is not intended to replace advice given to you by your health care provider. Make sure you discuss any questions you have with your health care provider. Document Revised: 03/23/2023 Document Reviewed: 03/23/2023 Elsevier Patient Education  2024 [employer].

## 2024-10-20 LAB — NUSWAB VG PLUS+MYCOPLASMAS,NAA
Atopobium vaginae: HIGH {score} — AB
Candida albicans, NAA: NEGATIVE
Candida glabrata, NAA: NEGATIVE
Chlamydia trachomatis, NAA: NEGATIVE
Mycoplasma genitalium NAA: NEGATIVE
Mycoplasma hominis NAA: POSITIVE — AB
Neisseria gonorrhoeae, NAA: NEGATIVE
Trich vag by NAA: NEGATIVE
Ureaplasma spp NAA: POSITIVE — AB

## 2024-10-22 ENCOUNTER — Ambulatory Visit: Payer: Self-pay | Admitting: Certified Nurse Midwife

## 2024-10-22 ENCOUNTER — Other Ambulatory Visit: Payer: Self-pay

## 2024-10-22 DIAGNOSIS — A493 Mycoplasma infection, unspecified site: Secondary | ICD-10-CM

## 2024-10-22 MED ORDER — DOXYCYCLINE HYCLATE 100 MG PO CAPS: 100.0000 mg | ORAL_CAPSULE | Freq: Two times a day (BID) | ORAL | 0 refills | Status: DC

## 2024-10-22 MED ORDER — DOXYCYCLINE HYCLATE 100 MG PO TABS: 100.0000 mg | ORAL_TABLET | Freq: Two times a day (BID) | ORAL | 0 refills | Status: DC

## 2024-10-24 ENCOUNTER — Ambulatory Visit: Admitting: Nurse Practitioner

## 2024-11-01 ENCOUNTER — Other Ambulatory Visit: Payer: Self-pay | Admitting: Nurse Practitioner

## 2024-11-05 NOTE — Telephone Encounter (Signed)
 Requested medication (s) are due for refill today: Yes  Requested medication (s) are on the active medication list: Yes  Last refill:    Future visit scheduled: No  Notes to clinic:  Historical provider.    Requested Prescriptions  Pending Prescriptions Disp Refills   escitalopram  (LEXAPRO ) 10 MG tablet [Pharmacy Med Name: ESCITALOPRAM  10MG  TABLETS] 30 tablet     Sig: TAKE 1 TABLET(10 MG) BY MOUTH DAILY     Psychiatry:  Antidepressants - SSRI Failed - 11/05/2024  3:25 PM      Failed - Valid encounter within last 6 months    Recent Outpatient Visits           8 months ago Annual physical exam   [facility] Gareth Mliss FALCON, FNP   9 months ago Scabies   [facility] Gareth Mliss F, FNP   10 months ago Moderate episode of recurrent major depressive disorder [facility])   [facility] Gareth Mliss FALCON, FNP              Passed - Completed PHQ-2 or PHQ-9 in the last 360 days

## 2024-12-14 ENCOUNTER — Ambulatory Visit: Admitting: Nurse Practitioner

## 2024-12-14 ENCOUNTER — Encounter: Payer: Self-pay | Admitting: Nurse Practitioner

## 2024-12-14 ENCOUNTER — Other Ambulatory Visit (HOSPITAL_COMMUNITY)
Admission: RE | Admit: 2024-12-14 | Discharge: 2024-12-14 | Disposition: A | Source: Ambulatory Visit | Attending: Nurse Practitioner | Admitting: Nurse Practitioner

## 2024-12-14 VITALS — BP 130/86 | HR 54 | Temp 97.6°F | Ht 62.0 in | Wt 194.0 lb

## 2024-12-14 DIAGNOSIS — N898 Other specified noninflammatory disorders of vagina: Secondary | ICD-10-CM | POA: Insufficient documentation

## 2024-12-14 DIAGNOSIS — Z113 Encounter for screening for infections with a predominantly sexual mode of transmission: Secondary | ICD-10-CM

## 2024-12-14 DIAGNOSIS — F419 Anxiety disorder, unspecified: Secondary | ICD-10-CM

## 2024-12-14 DIAGNOSIS — F331 Major depressive disorder, recurrent, moderate: Secondary | ICD-10-CM

## 2024-12-14 LAB — POCT URINALYSIS DIPSTICK
Bilirubin, UA: NEGATIVE
Blood, UA: NEGATIVE
Glucose, UA: NEGATIVE
Leukocytes, UA: NEGATIVE
Nitrite, UA: NEGATIVE
Protein, UA: POSITIVE — AB
Spec Grav, UA: >=1.03 — AB
Urobilinogen, UA: 0.2 U/dL
pH, UA: 5

## 2024-12-14 LAB — CERVICOVAGINAL ANCILLARY ONLY
Bacterial Vaginitis (gardnerella): POSITIVE — AB
Candida Glabrata: NEGATIVE
Candida Vaginitis: NEGATIVE
Chlamydia: NEGATIVE
Comment: NEGATIVE
Comment: NEGATIVE
Comment: NEGATIVE
Comment: NEGATIVE
Comment: NEGATIVE
Comment: NORMAL
Neisseria Gonorrhea: NEGATIVE
Trichomonas: NEGATIVE

## 2024-12-14 NOTE — Progress Notes (Signed)
 "  BP 130/86   Pulse (!) 54   Temp 97.6 F (36.4 C)   Ht 5' 2 (1.575 m)   Wt 194 lb (88 kg)   LMP 12/04/2024   SpO2 96%   BMI 35.48 kg/m    Subjective:    Patient ID: Alexandra Boyle, female    DOB: 06-May-2001, 24 y.o.   MRN: [id number]  HPI: Alexandra Boyle is a 23 y.o. female  Chief Complaint  Patient presents with   Vaginal Discharge    Pt c/o vaginal discharge and itchiness. Last sexual encounter 4 weeks ago.    Discussed the use of AI scribe software for clinical note transcription with the patient, who gave verbal consent to proceed.  History of Present Illness Alexandra Boyle is a 24 year old female who presents with vaginal discharge and itchiness.  Vaginal discharge and pruritus - Vaginal discharge and itchiness present - Previously tested positive for mycoplasma and ureaplasma - Completed treatment with doxycycline  - Recurrence of discharge after treatment - No urinary symptoms such as increased frequency or urgency - Last menstrual period on the 20th of the month  Sexual activity - Sexually active  Psychiatric medication - Currently taking Alexandra Boyle  10 mg daily - Stable on current regimen with recent 90-day refill         12/14/2024    8:54 AM 02/13/2024    3:26 PM 01/04/2024    8:54 AM  Depression screen PHQ 2/9  Decreased Interest 0 1 2  Down, Depressed, Hopeless 0 1 3  PHQ - 2 Score 0 2 5  Altered sleeping 0 2 2  Tired, decreased energy 0 0 1  Change in appetite 0  2  Feeling bad or failure about yourself  0 2 3  Trouble concentrating 0 1 1  Moving slowly or fidgety/restless 0 0 0  Suicidal thoughts 0 0 2  PHQ-9 Score 0 7  16   Difficult doing work/chores  Somewhat difficult Very difficult     Data saved with a previous flowsheet row definition       12/14/2024    9:26 AM 01/04/2024    8:55 AM 12/16/2023    8:59 AM  GAD 7 : Generalized Anxiety Score  Nervous, Anxious, on Edge 0 0  0   Control/stop worrying 0 2  0   Worry too much - different things  0 2  0   Trouble relaxing 0 1  0   Restless 0 0  0   Easily annoyed or irritable 0 3  0   Afraid - awful might happen 0 2  0   Total GAD 7 Score 0 10 0  Anxiety Difficulty Not difficult at all Very difficult Not difficult at all     Data saved with a previous flowsheet row definition     Relevant past medical, surgical, family and social history reviewed and updated as indicated. Interim medical history since our last visit reviewed. Allergies and medications reviewed and updated.  Review of Systems  Ten systems reviewed and is negative except as mentioned in HPI      Objective:      BP 130/86   Pulse (!) 54   Temp 97.6 F (36.4 C)   Ht 5' 2 (1.575 m)   Wt 194 lb (88 kg)   LMP 12/04/2024   SpO2 96%   BMI 35.48 kg/m    Wt Readings from Last 3 Encounters:  12/14/24 194 lb (88 kg)  10/16/24 194 lb (  88 kg)  07/20/24 199 lb 14.4 oz (90.7 kg)    Physical Exam GENERAL: Alert, cooperative, well developed, no acute distress HEENT: Normocephalic, normal oropharynx, moist mucous membranes CHEST: Clear to auscultation bilaterally, No wheezes, rhonchi, or crackles CARDIOVASCULAR: Normal heart rate and rhythm, S1 and S2 normal without murmurs ABDOMEN: Soft, non-tender, non-distended, without organomegaly, Normal bowel sounds EXTREMITIES: No cyanosis or edema NEUROLOGICAL: Cranial nerves grossly intact, Moves all extremities without gross motor or sensory deficit  Results for orders placed or performed in visit on 12/14/24  POCT Urinalysis Dipstick   Collection Time: 12/14/24 10:25 AM  Result Value Ref Range   Color, UA yellow    Clarity, UA clear    Glucose, UA Negative Negative   Bilirubin, UA negative    Ketones, UA large    Spec Grav, UA >=1.030 (A) 1.010 - 1.025   Blood, UA negative    pH, UA 5.0 5.0 - 8.0   Protein, UA Positive (A) Negative   Urobilinogen, UA 0.2 0.2 or 1.0 E.U./dL   Nitrite, UA negative    Leukocytes, UA Negative Negative   Appearance     Odor             Assessment & Plan:   Problem List Items Addressed This Visit       Other   Moderate episode of recurrent major depressive disorder (HCC)   Anxiety   Other Visit Diagnoses       Vaginal discharge    -  Primary   Relevant Orders   Cervicovaginal ancillary only   POCT Urinalysis Dipstick (Completed)   RPR W/RFLX TO RPR TITER, TREPONEMAL AB, SCREEN AND DIAGNOSIS   Hepatitis C antibody   HIV Antibody (routine testing w rflx)     Screening examination for STD (sexually transmitted disease)       Relevant Orders   RPR W/RFLX TO RPR TITER, TREPONEMAL AB, SCREEN AND DIAGNOSIS   Hepatitis C antibody   HIV Antibody (routine testing w rflx)        Assessment and Plan Assessment & Plan Vaginal discharge Recurrent vaginal discharge with itchiness. Previous positive tests for mycoplasma and ureoplasma, treated with doxycycline . No urinary symptoms reported. - Ordered urine sample for analysis - Ordered STD screenings including HIV and syphilis  Major depressive disorder, recurrent, moderate/anxiety Currently managed with Alexandra Boyle .        Follow up plan: Return if symptoms worsen or fail to improve. "

## 2024-12-15 LAB — HIV ANTIBODY (ROUTINE TESTING W REFLEX)
HIV 1&2 Ab, 4th Generation: NONREACTIVE
HIV FINAL INTERPRETATION: NEGATIVE

## 2024-12-15 LAB — SYPHILIS: RPR W/REFLEX TO RPR TITER AND TREPONEMAL ANTIBODIES, TRADITIONAL SCREENING AND DIAGNOSIS ALGORITHM: RPR Ser Ql: NONREACTIVE

## 2024-12-15 LAB — HEPATITIS C ANTIBODY: Hepatitis C Ab: NONREACTIVE

## 2024-12-17 ENCOUNTER — Ambulatory Visit: Payer: Self-pay | Admitting: Nurse Practitioner

## 2024-12-17 DIAGNOSIS — B9689 Other specified bacterial agents as the cause of diseases classified elsewhere: Secondary | ICD-10-CM

## 2024-12-17 MED ORDER — METRONIDAZOLE 500 MG PO TABS: 500.0000 mg | ORAL_TABLET | Freq: Two times a day (BID) | ORAL | 0 refills | Status: AC

## 2025-02-18 ENCOUNTER — Ambulatory Visit: Admitting: Nurse Practitioner
